# Patient Record
Sex: Male | Born: 1955 | Race: White | Hispanic: No | Marital: Married | Smoking: Never smoker
Health system: Southern US, Community
[De-identification: ages and names within clinical notes are randomized; demographics above are authoritative.]

## PROBLEM LIST (undated history)

## (undated) DIAGNOSIS — I493 Ventricular premature depolarization: Principal | ICD-10-CM

## (undated) DIAGNOSIS — M5030 Other cervical disc degeneration, unspecified cervical region: Secondary | ICD-10-CM

## (undated) DIAGNOSIS — G4730 Sleep apnea, unspecified: Secondary | ICD-10-CM

## (undated) DIAGNOSIS — R0789 Other chest pain: Secondary | ICD-10-CM

## (undated) DIAGNOSIS — R002 Palpitations: Secondary | ICD-10-CM

## (undated) DIAGNOSIS — T7840XA Allergy, unspecified, initial encounter: Secondary | ICD-10-CM

## (undated) DIAGNOSIS — N183 Chronic kidney disease, stage 3 unspecified: Secondary | ICD-10-CM

## (undated) DIAGNOSIS — K219 Gastro-esophageal reflux disease without esophagitis: Secondary | ICD-10-CM

## (undated) DIAGNOSIS — K449 Diaphragmatic hernia without obstruction or gangrene: Secondary | ICD-10-CM

## (undated) DIAGNOSIS — M109 Gout, unspecified: Secondary | ICD-10-CM

## (undated) DIAGNOSIS — K760 Fatty (change of) liver, not elsewhere classified: Secondary | ICD-10-CM

## (undated) DIAGNOSIS — I10 Essential (primary) hypertension: Secondary | ICD-10-CM

## (undated) DIAGNOSIS — Z8249 Family history of ischemic heart disease and other diseases of the circulatory system: Secondary | ICD-10-CM

## (undated) HISTORY — DX: Family history of ischemic heart disease and other diseases of the circulatory system: Z82.49

## (undated) HISTORY — DX: Chronic kidney disease, stage 3 unspecified: N18.30

## (undated) HISTORY — PX: NO PAST SURGERIES: SHX2092

## (undated) HISTORY — DX: Gastro-esophageal reflux disease without esophagitis: K21.9

## (undated) HISTORY — DX: Other cervical disc degeneration, unspecified cervical region: M50.30

## (undated) HISTORY — DX: Fatty (change of) liver, not elsewhere classified: K76.0

## (undated) HISTORY — DX: Ventricular premature depolarization: I49.3

## (undated) HISTORY — DX: Other chest pain: R07.89

## (undated) HISTORY — DX: Sleep apnea, unspecified: G47.30

## (undated) HISTORY — DX: Gout, unspecified: M10.9

## (undated) HISTORY — DX: Allergy, unspecified, initial encounter: T78.40XA

## (undated) HISTORY — DX: Diaphragmatic hernia without obstruction or gangrene: K44.9

---

## 2001-07-17 ENCOUNTER — Encounter: Admission: RE | Admit: 2001-07-17 | Discharge: 2001-10-15 | Payer: Self-pay | Admitting: Family Medicine

## 2005-02-24 ENCOUNTER — Emergency Department (HOSPITAL_COMMUNITY): Admission: EM | Admit: 2005-02-24 | Discharge: 2005-02-25 | Payer: Self-pay | Admitting: Emergency Medicine

## 2006-05-22 ENCOUNTER — Encounter: Admission: RE | Admit: 2006-05-22 | Discharge: 2006-05-22 | Payer: Self-pay | Admitting: Gastroenterology

## 2006-07-20 ENCOUNTER — Emergency Department (HOSPITAL_COMMUNITY): Admission: EM | Admit: 2006-07-20 | Discharge: 2006-07-20 | Payer: Self-pay | Admitting: Emergency Medicine

## 2006-10-03 ENCOUNTER — Emergency Department (HOSPITAL_COMMUNITY): Admission: EM | Admit: 2006-10-03 | Discharge: 2006-10-03 | Payer: Self-pay | Admitting: Emergency Medicine

## 2006-10-09 ENCOUNTER — Encounter: Admission: RE | Admit: 2006-10-09 | Discharge: 2006-10-09 | Payer: Self-pay | Admitting: Gastroenterology

## 2010-08-28 ENCOUNTER — Encounter: Payer: Self-pay | Admitting: Gastroenterology

## 2015-10-16 ENCOUNTER — Encounter (HOSPITAL_BASED_OUTPATIENT_CLINIC_OR_DEPARTMENT_OTHER): Payer: Self-pay | Admitting: Emergency Medicine

## 2015-10-16 ENCOUNTER — Other Ambulatory Visit: Payer: Self-pay

## 2015-10-16 ENCOUNTER — Emergency Department (HOSPITAL_BASED_OUTPATIENT_CLINIC_OR_DEPARTMENT_OTHER): Payer: Managed Care, Other (non HMO)

## 2015-10-16 ENCOUNTER — Emergency Department (HOSPITAL_BASED_OUTPATIENT_CLINIC_OR_DEPARTMENT_OTHER)
Admission: EM | Admit: 2015-10-16 | Discharge: 2015-10-17 | Disposition: A | Discharge status: Home / Self Care | Payer: Managed Care, Other (non HMO) | Attending: Emergency Medicine | Admitting: Emergency Medicine

## 2015-10-16 DIAGNOSIS — S20311A Abrasion of right front wall of thorax, initial encounter: Secondary | ICD-10-CM | POA: Diagnosis not present

## 2015-10-16 DIAGNOSIS — R002 Palpitations: Secondary | ICD-10-CM | POA: Diagnosis present

## 2015-10-16 DIAGNOSIS — Y92410 Unspecified street and highway as the place of occurrence of the external cause: Secondary | ICD-10-CM | POA: Insufficient documentation

## 2015-10-16 DIAGNOSIS — S40211A Abrasion of right shoulder, initial encounter: Secondary | ICD-10-CM | POA: Diagnosis not present

## 2015-10-16 DIAGNOSIS — S20211A Contusion of right front wall of thorax, initial encounter: Secondary | ICD-10-CM | POA: Insufficient documentation

## 2015-10-16 DIAGNOSIS — Z79899 Other long term (current) drug therapy: Secondary | ICD-10-CM | POA: Diagnosis not present

## 2015-10-16 DIAGNOSIS — R739 Hyperglycemia, unspecified: Secondary | ICD-10-CM | POA: Insufficient documentation

## 2015-10-16 DIAGNOSIS — I10 Essential (primary) hypertension: Secondary | ICD-10-CM | POA: Diagnosis not present

## 2015-10-16 DIAGNOSIS — Y9389 Activity, other specified: Secondary | ICD-10-CM | POA: Diagnosis not present

## 2015-10-16 DIAGNOSIS — S40011A Contusion of right shoulder, initial encounter: Secondary | ICD-10-CM | POA: Diagnosis not present

## 2015-10-16 DIAGNOSIS — Y998 Other external cause status: Secondary | ICD-10-CM | POA: Insufficient documentation

## 2015-10-16 DIAGNOSIS — I493 Ventricular premature depolarization: Secondary | ICD-10-CM | POA: Diagnosis not present

## 2015-10-16 HISTORY — DX: Essential (primary) hypertension: I10

## 2015-10-16 HISTORY — DX: Palpitations: R00.2

## 2015-10-16 LAB — BASIC METABOLIC PANEL
Anion gap: 9 (ref 5–15)
BUN: 23 mg/dL — AB (ref 6–20)
CHLORIDE: 100 mmol/L — AB (ref 101–111)
CO2: 25 mmol/L (ref 22–32)
Calcium: 8.7 mg/dL — ABNORMAL LOW (ref 8.9–10.3)
Creatinine, Ser: 1.24 mg/dL (ref 0.61–1.24)
GFR calc Af Amer: >60 mL/min (ref >60)
Glucose, Bld: 161 mg/dL — ABNORMAL HIGH (ref 65–99)
POTASSIUM: 3.7 mmol/L (ref 3.5–5.1)
SODIUM: 134 mmol/L — AB (ref 135–145)

## 2015-10-16 LAB — CBC WITH DIFFERENTIAL/PLATELET
Basophils Absolute: 0 10*3/uL (ref 0.0–0.1)
Basophils Relative: 1 %
EOS ABS: 0.3 10*3/uL (ref 0.0–0.7)
EOS PCT: 4 %
HCT: 44.2 % (ref 39.0–52.0)
Hemoglobin: 14.6 g/dL (ref 13.0–17.0)
LYMPHS ABS: 2.4 10*3/uL (ref 0.7–4.0)
LYMPHS PCT: 30 %
MCH: 28.3 pg (ref 26.0–34.0)
MCHC: 33 g/dL (ref 30.0–36.0)
MCV: 85.8 fL (ref 78.0–100.0)
MONO ABS: 0.6 10*3/uL (ref 0.1–1.0)
MONOS PCT: 8 %
Neutro Abs: 4.6 10*3/uL (ref 1.7–7.7)
Neutrophils Relative %: 57 %
PLATELETS: 243 10*3/uL (ref 150–400)
RBC: 5.15 MIL/uL (ref 4.22–5.81)
RDW: 13.4 % (ref 11.5–15.5)
WBC: 8 10*3/uL (ref 4.0–10.5)

## 2015-10-16 LAB — D-DIMER, QUANTITATIVE (NOT AT ARMC): D DIMER QUANT: 0.42 ug{FEU}/mL (ref 0.00–0.50)

## 2015-10-16 NOTE — ED Notes (Signed)
Patient states that he feels like his heart is skipping a beat. The patient reports that about 8 years ago he has episodes of increased PVC/ The patient denies chest pain.
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
## 2015-10-16 NOTE — ED Notes (Signed)
Patient is having frequent PVC's including cuplets in triage.

## 2015-10-17 LAB — TROPONIN I
Troponin I: <0.03 ng/mL (ref <0.031)
Troponin I: <0.03 ng/mL (ref <0.031)

## 2015-10-17 NOTE — ED Notes (Signed)
Assumed care of patient from [place], [place]. PT resting quietly at this time. NO distress. Denies pain. Will reassess Troponin at 0200.

## 2015-10-17 NOTE — Discharge Instructions (Signed)
He was seen today for palpitations. Your workup is reassuring. You were noted to have a blood sugar of 161. You need to have this rechecked by her primary Dr. as he has no history of diabetes. You need follow-up with cardiology for further evaluation. He may benefit from being placed on a beta blocker.  Premature Ventricular Contraction A premature ventricular contraction is an irregularity in the normal heart rhythm. These contractions are extra heartbeats that occur too early in the normal sequence. In most cases, these contractions are harmless and do not require treatment. CAUSES Premature ventricular contractions may occur without a known cause. In healthy people, the extra contractions may be caused by:  Smoking.  Drinking alcohol.  Caffeine.  Certain medicines.  Some illegal drugs.  Stress. Sometimes, changes in chemicals in the blood (electrolytes) can also cause premature ventricular contractions. They can also occur in people with heart diseases that cause a decrease in blood flow to the heart. SIGNS AND SYMPTOMS Premature ventricular contractions often do not cause any symptoms. In some cases, you may have a feeling of your heart beating fast or skipping a beat (palpitations). DIAGNOSIS Your health care provider will take your medical history and do a physical exam. During the exam, the health care provider will check for irregular heartbeats. Various tests may be done to help diagnose premature ventricular contractions. These tests may include:  An ECG (electrocardiogram) to monitor the electrical activity of your heart.  Holter monitor testing. A Holter monitor is a portable device that can monitor the electrical activity of your heart over longer periods of time.  Stress tests to see how exercise affects your heart rhythm.  Echocardiogram. This test uses sound waves (ultrasound) to produce an image of your heart.  Electrophysiology study. This is used to evaluate the  electrical conduction system of your heart. TREATMENT Usually, no treatment is needed. You may be advised to avoid things that can trigger the premature contractions, such as caffeine or alcohol. Medicines are sometimes given if symptoms are severe or if the extra heartbeats are very frequent. Treatment may also be needed for an underlying cause of the contractions if one is found. HOME CARE INSTRUCTIONS  Take medicines only as directed by your health care provider.  Make any lifestyle changes recommended by your health care provider. These may include:  Quitting smoking.  Avoiding or limiting caffeine or alcohol.  Exercising. Talk to your health care provider about what type of exercise is safe for you.  Trying to reduce stress.  Keep all follow-up visits with your health care provider. This is important. SEEK IMMEDIATE MEDICAL CARE IF:  You feel palpitations that are frequent or continual.  You have chest pain.  You have shortness of breath.  You have sweating for no reason.  You have nausea and vomiting.  You become light-headed or faint.   This information is not intended to replace advice given to you by your health care provider. Make sure you discuss any questions you have with your health care provider.   Document Released: 03/10/2004 Document Revised: 08/14/2014 Document Reviewed: 12/25/2013 Elsevier Interactive Patient Education [employer]2016 Elsevier Inc.

## 2015-11-02 ENCOUNTER — Encounter: Payer: Self-pay | Admitting: Cardiology

## 2015-11-02 ENCOUNTER — Ambulatory Visit (INDEPENDENT_AMBULATORY_CARE_PROVIDER_SITE_OTHER): Payer: Managed Care, Other (non HMO) | Admitting: Cardiology

## 2015-11-02 VITALS — BP 138/86 | HR 82 | Ht 70.0 in | Wt 180.0 lb

## 2015-11-02 DIAGNOSIS — Z8249 Family history of ischemic heart disease and other diseases of the circulatory system: Secondary | ICD-10-CM

## 2015-11-02 DIAGNOSIS — R079 Chest pain, unspecified: Secondary | ICD-10-CM | POA: Insufficient documentation

## 2015-11-02 DIAGNOSIS — I159 Secondary hypertension, unspecified: Secondary | ICD-10-CM

## 2015-11-02 DIAGNOSIS — I493 Ventricular premature depolarization: Secondary | ICD-10-CM

## 2015-11-02 DIAGNOSIS — I10 Essential (primary) hypertension: Secondary | ICD-10-CM | POA: Diagnosis not present

## 2015-11-02 HISTORY — DX: Ventricular premature depolarization: I49.3

## 2015-11-02 HISTORY — DX: Family history of ischemic heart disease and other diseases of the circulatory system: Z82.49

## 2015-11-02 LAB — TSH: TSH: 1.7 mIU/L (ref 0.40–4.50)
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
## 2015-11-02 NOTE — Patient Instructions (Signed)
Medication Instructions:  Your physician recommends that you continue on your current medications as directed. Please refer to the Current Medication list given to you today.   Labwork: TODAY: TSH  Testing/Procedures: Your physician has requested that you have an echocardiogram. Echocardiography is a painless test that uses sound waves to create images of your heart. It provides your doctor with information about the size and shape of your heart and how well your heart's chambers and valves are working. This procedure takes approximately one hour. There are no restrictions for this procedure.   Your physician has recommended that you wear a holter monitor. Holter monitors are medical devices that record the heart's electrical activity. Doctors most often use these monitors to diagnose arrhythmias. Arrhythmias are problems with the speed or rhythm of the heartbeat. The monitor is a small, portable device. You can wear one while you do your normal daily activities. This is usually used to diagnose what is causing palpitations/syncope (passing out).  Dr. Mayford Knifeurner recommends you have an EXERCISE NUCLEAR STRESS TEST.  Follow-Up: Your physician wants you to follow-up in: 1 year with Dr. Mayford Knifeurner. You will receive a reminder letter in the mail two months in advance. If you don't receive a letter, please call our office to schedule the follow-up appointment.   Any Other Special Instructions Will Be Listed Below (If Applicable).     If you need a refill on your cardiac medications before your next appointment, please call your pharmacy.

## 2015-11-17 ENCOUNTER — Telehealth (HOSPITAL_COMMUNITY): Payer: Self-pay | Admitting: *Deleted

## 2015-11-17 NOTE — Telephone Encounter (Signed)
Left message on voicemail in reference to upcoming appointment scheduled for 11/22/15. Phone number given for a call back so details instructions can be given. Vane Yapp J Malinda Mayden, RN 

## 2015-11-19 ENCOUNTER — Ambulatory Visit (HOSPITAL_BASED_OUTPATIENT_CLINIC_OR_DEPARTMENT_OTHER): Payer: Managed Care, Other (non HMO)

## 2015-11-19 ENCOUNTER — Ambulatory Visit (INDEPENDENT_AMBULATORY_CARE_PROVIDER_SITE_OTHER): Payer: Managed Care, Other (non HMO)

## 2015-11-19 ENCOUNTER — Other Ambulatory Visit: Payer: Self-pay

## 2015-11-19 ENCOUNTER — Ambulatory Visit (HOSPITAL_COMMUNITY): Payer: Managed Care, Other (non HMO) | Attending: Cardiovascular Disease

## 2015-11-19 DIAGNOSIS — I493 Ventricular premature depolarization: Secondary | ICD-10-CM | POA: Insufficient documentation

## 2015-11-19 DIAGNOSIS — I4510 Unspecified right bundle-branch block: Secondary | ICD-10-CM | POA: Insufficient documentation

## 2015-11-19 DIAGNOSIS — Z8249 Family history of ischemic heart disease and other diseases of the circulatory system: Secondary | ICD-10-CM | POA: Diagnosis not present

## 2015-11-19 DIAGNOSIS — I10 Essential (primary) hypertension: Secondary | ICD-10-CM | POA: Diagnosis not present

## 2015-11-19 DIAGNOSIS — R002 Palpitations: Secondary | ICD-10-CM | POA: Diagnosis not present

## 2015-11-19 DIAGNOSIS — R079 Chest pain, unspecified: Secondary | ICD-10-CM | POA: Diagnosis not present

## 2015-11-19 DIAGNOSIS — I340 Nonrheumatic mitral (valve) insufficiency: Secondary | ICD-10-CM | POA: Insufficient documentation

## 2015-11-19 LAB — MYOCARDIAL PERFUSION IMAGING
CHL CUP NUCLEAR SRS: 9
CHL CUP NUCLEAR SSS: 7
CSEPHR: 109 %
CSEPPHR: 176 {beats}/min
Estimated workload: 10.1 METS
Exercise duration (min): 9 min
Exercise duration (sec): 0 s
LHR: 0.23
LV dias vol: 60 mL (ref 62–150)
LVSYSVOL: 16 mL
MPHR: 161 {beats}/min
Rest HR: 72 {beats}/min
SDS: 0
TID: 0.89

## 2015-11-19 MED ORDER — TECHNETIUM TC 99M SESTAMIBI GENERIC - CARDIOLITE
10.7000 | Freq: Once | INTRAVENOUS | Status: AC | PRN
Start: 2015-11-19 — End: 2015-11-19
  Administered 2015-11-19: 11 via INTRAVENOUS

## 2015-11-19 MED ORDER — TECHNETIUM TC 99M SESTAMIBI GENERIC - CARDIOLITE
32.9000 | Freq: Once | INTRAVENOUS | Status: AC | PRN
  Administered 2015-11-19: 32.9 via INTRAVENOUS

## 2015-11-22 ENCOUNTER — Telehealth: Payer: Self-pay

## 2015-11-22 NOTE — Telephone Encounter (Signed)
Informed patient of results and verbal understanding expressed.   Patient requests a call from a monitor tech to discuss sounds his holter made while he wore it to make sure his results will be accurate.  To Derek Kent and Derek Kent for follow-up.

## 2015-11-29 ENCOUNTER — Telehealth: Payer: Self-pay | Admitting: *Deleted

## 2015-11-29 NOTE — Telephone Encounter (Signed)
See 4/24 phone encounter.

## 2015-11-29 NOTE — Telephone Encounter (Signed)
Call requested from patient to speak to monitor tech to check accuracy of holter monitor since it made noise during recording. Informed patient we could not give him any information regarding the quality or accuracy of his holter monitor recording until the results were back from [facility]. Results were imported and forwarded to Dr. Mayford Knifeurner for review.  Derek FellsKaty Kemp, RN, will call with results.  However there was no indication of monitor malfunction on his report.

## 2015-12-01 ENCOUNTER — Telehealth: Payer: Self-pay

## 2015-12-01 NOTE — Telephone Encounter (Signed)
Quintella Reichertraci R Turner, MD   Sent: Mon November 29, 2015 7:39 PM    To: Henrietta DineKathryn A Ada Woodbury, RN        Message     NOrmal LVF and no ischemia no stress test. PVC's are benign. Can treat with meds if patient wishes but these are minimal and benign.     Unable to leave message as VM has not yet been set up.  Will try again later.

## 2015-12-03 NOTE — Telephone Encounter (Signed)
Informed patient of results and verbal understanding expressed.  Patient is asymptomatic at this time. He st he will call if PVCs become a problem to start medication. He was grateful for call.

## 2016-10-23 ENCOUNTER — Encounter: Payer: Self-pay | Admitting: *Deleted

## 2016-11-01 ENCOUNTER — Ambulatory Visit (INDEPENDENT_AMBULATORY_CARE_PROVIDER_SITE_OTHER): Payer: Managed Care, Other (non HMO) | Admitting: Cardiology

## 2016-11-01 ENCOUNTER — Ambulatory Visit (INDEPENDENT_AMBULATORY_CARE_PROVIDER_SITE_OTHER)
Admission: RE | Admit: 2016-11-01 | Discharge: 2016-11-01 | Disposition: A | Discharge status: Home / Self Care | Payer: Self-pay | Source: Ambulatory Visit | Attending: Cardiology | Admitting: Cardiology

## 2016-11-01 ENCOUNTER — Encounter: Payer: Self-pay | Admitting: Cardiology

## 2016-11-01 VITALS — BP 120/82 | HR 88 | Ht 69.5 in | Wt 185.0 lb

## 2016-11-01 DIAGNOSIS — I493 Ventricular premature depolarization: Secondary | ICD-10-CM | POA: Diagnosis not present

## 2016-11-01 DIAGNOSIS — I340 Nonrheumatic mitral (valve) insufficiency: Secondary | ICD-10-CM

## 2016-11-01 DIAGNOSIS — R079 Chest pain, unspecified: Secondary | ICD-10-CM

## 2016-11-01 DIAGNOSIS — R0789 Other chest pain: Secondary | ICD-10-CM | POA: Insufficient documentation

## 2016-11-01 DIAGNOSIS — I10 Essential (primary) hypertension: Secondary | ICD-10-CM

## 2016-11-01 NOTE — Patient Instructions (Signed)
Medication Instructions:  Your physician recommends that you continue on your current medications as directed. Please refer to the Current Medication list given to you today.   Labwork: None  Testing/Procedures: Your physician has requested that you have an echocardiogram in April, 2019. Echocardiography is a painless test that uses sound waves to create images of your heart. It provides your doctor with information about the size and shape of your heart and how well your heart's chambers and valves are working. This procedure takes approximately one hour. There are no restrictions for this procedure.   Dr. Mayford Knifeurner recommends you have a CALCIUM SCORE today.  Follow-Up: Your physician wants you to follow-up in: 1 year with Dr. Mayford Knifeurner. You will receive a reminder letter in the mail two months in advance. If you don't receive a letter, please call our office to schedule the follow-up appointment.   Any Other Special Instructions Will Be Listed Below (If Applicable).     If you need a refill on your cardiac medications before your next appointment, please call your pharmacy.
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
## 2016-11-02 ENCOUNTER — Other Ambulatory Visit: Payer: Managed Care, Other (non HMO)

## 2017-03-01 IMAGING — CR DG CHEST 2V
2 series · 2 of 2 positions shown · non-contrast
Comparison: None.

CLINICAL DATA: Palpitations for 1 hour.

EXAM:
CHEST  2 VIEW

[w chest pa]
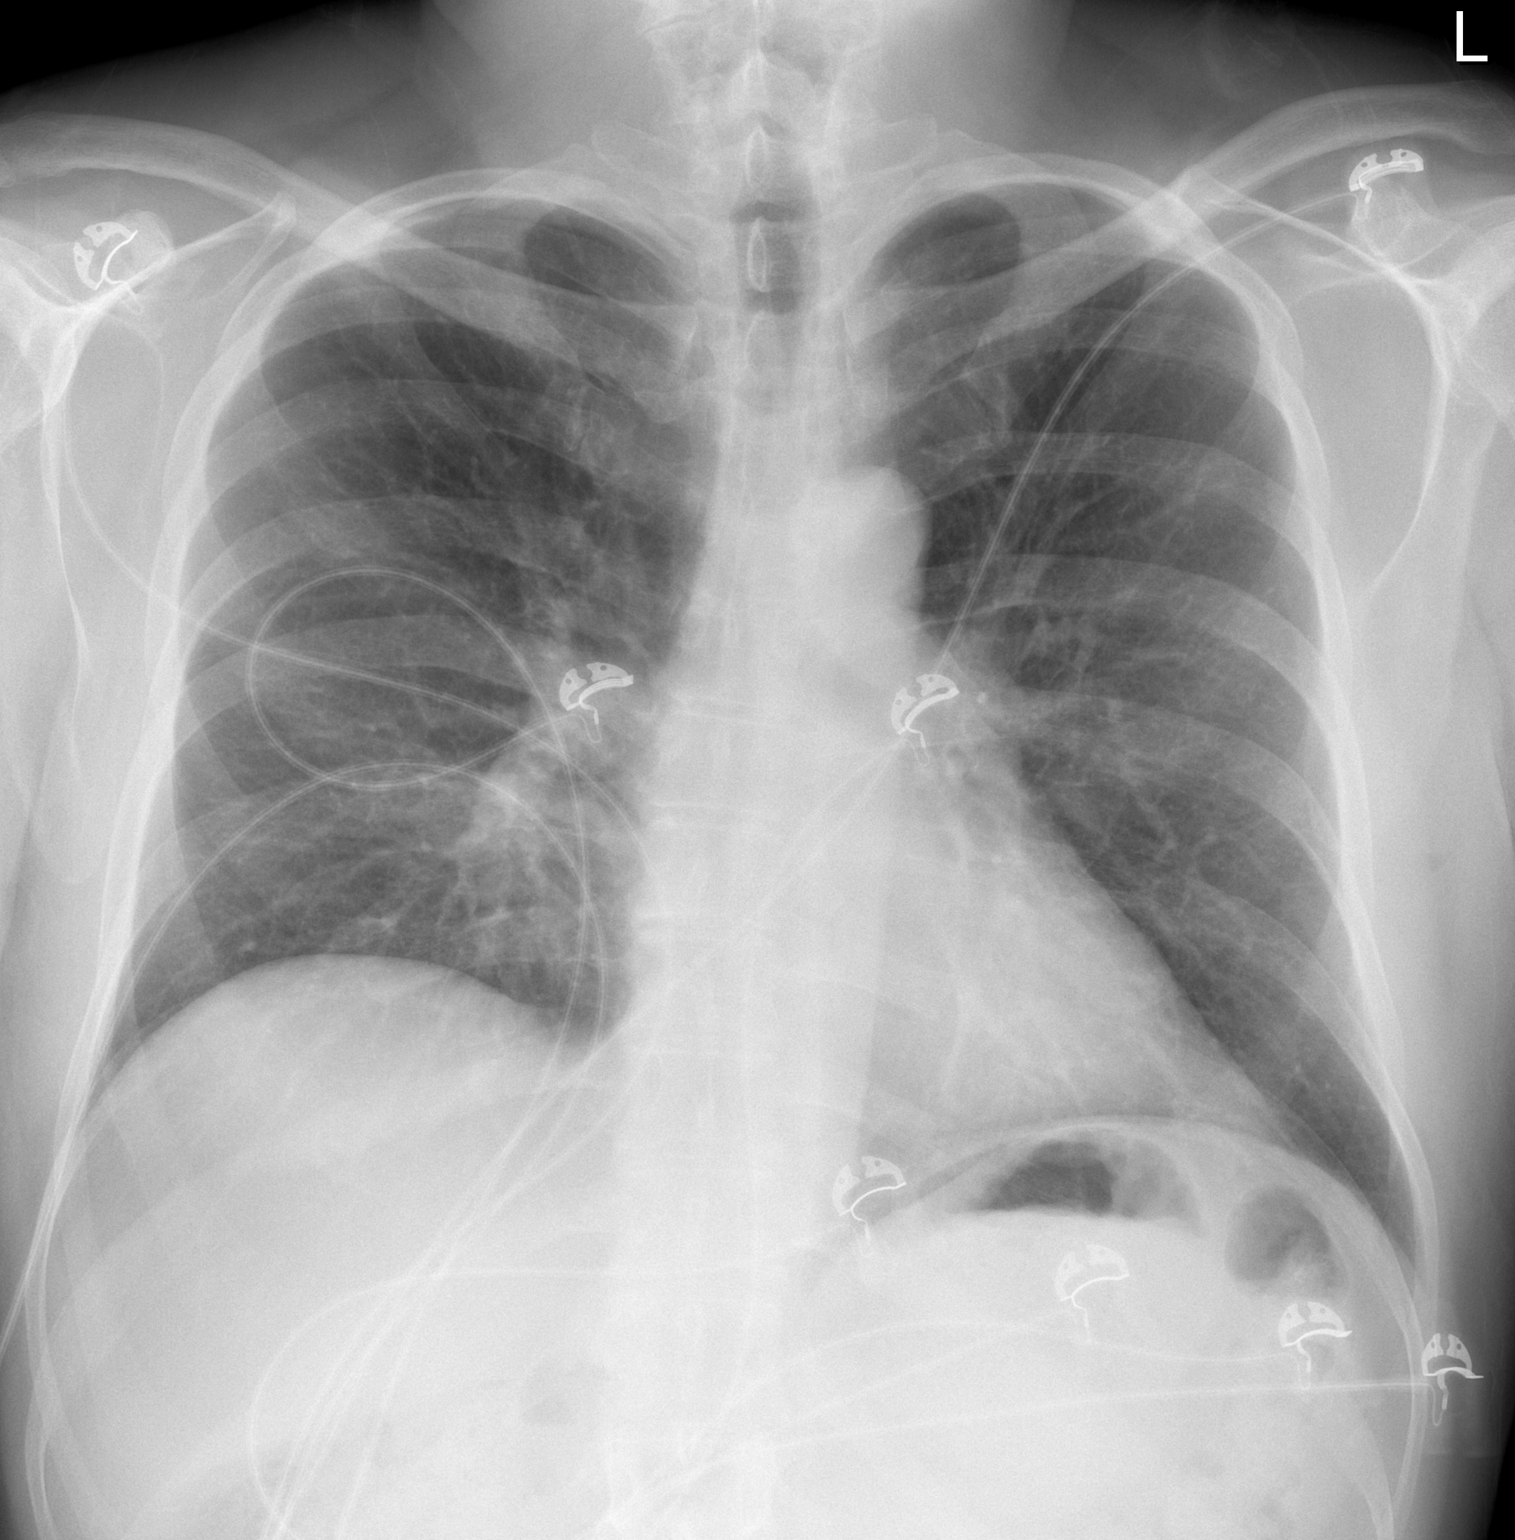

[w chest lat]
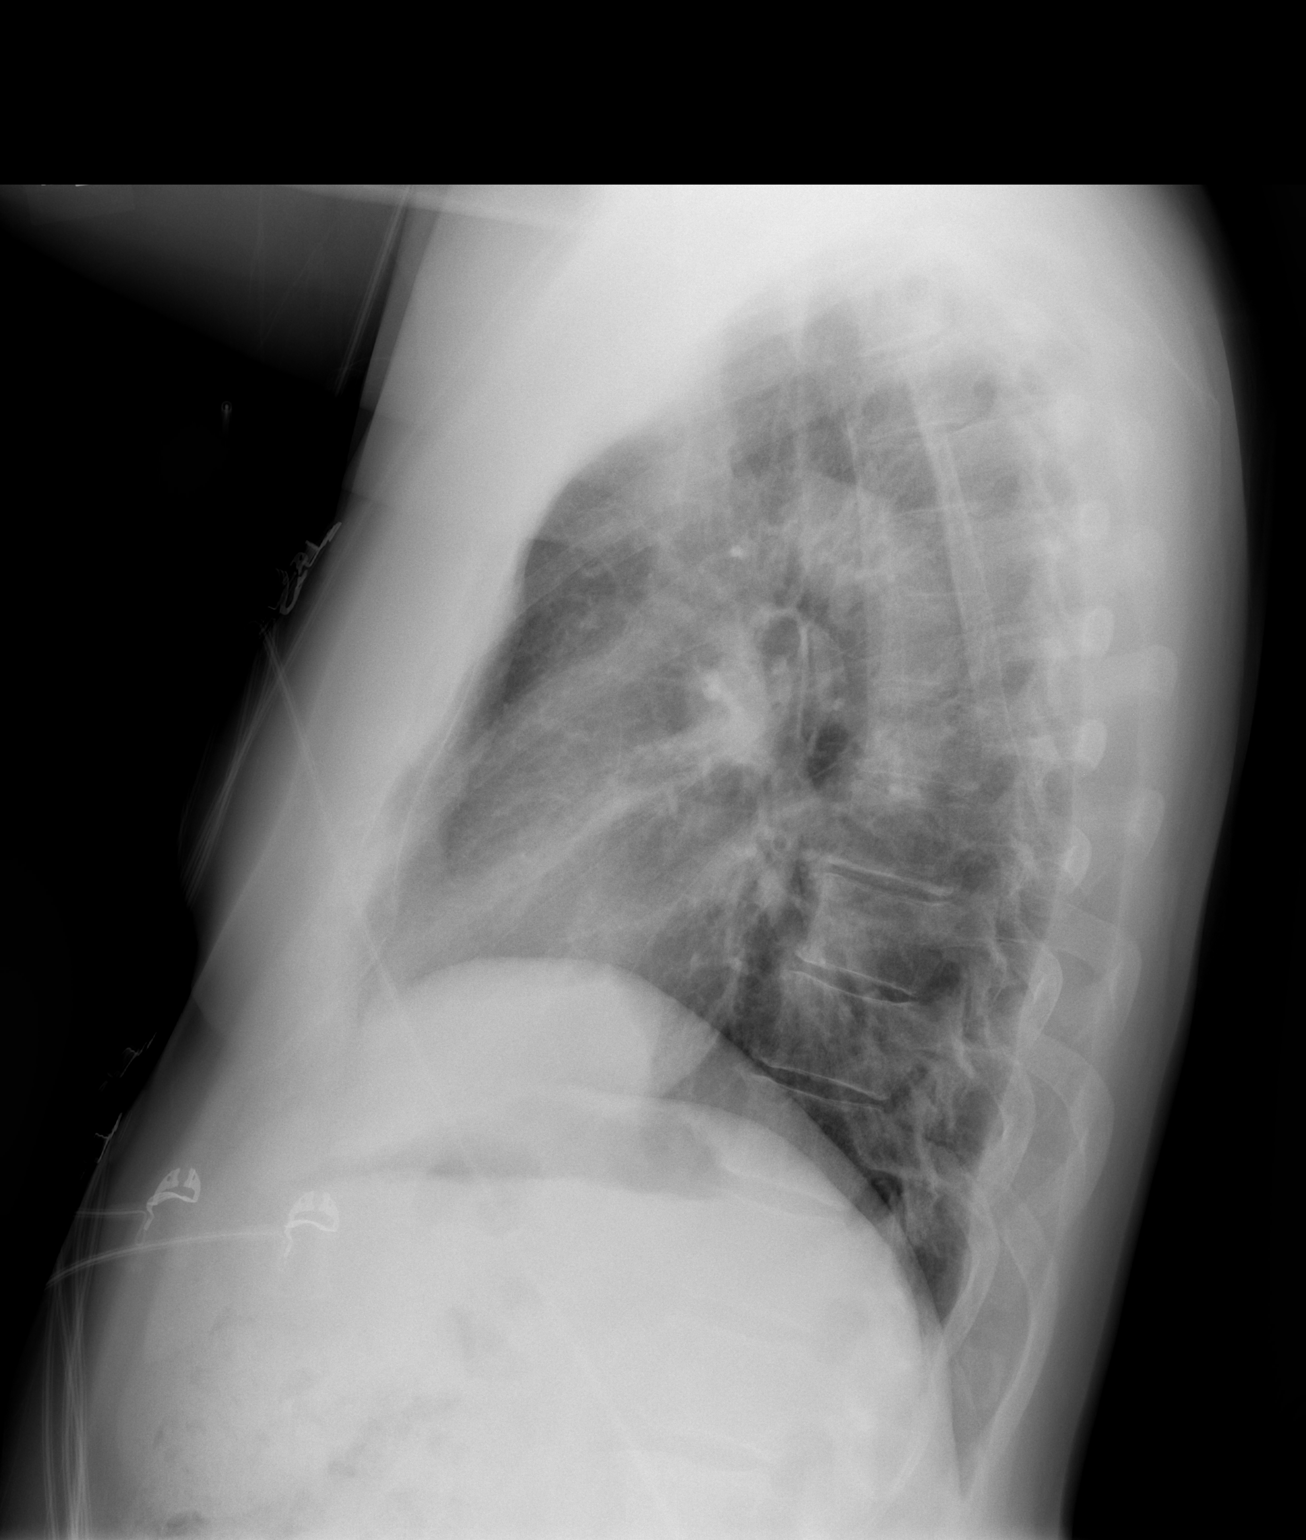

[2 of 2 positions shown; findings below may reference images not displayed]

FINDINGS: The cardiomediastinal contours are normal. The lungs are clear.
Pulmonary vasculature is normal. No consolidation, pleural effusion,
or pneumothorax. No acute osseous abnormalities are seen.
IMPRESSION: No acute pulmonary process.

## 2017-04-03 IMAGING — NM NM MISC PROCEDURE
6 series · 36 of 36 positions shown · non-contrast
Comparison: none

[Series 1: wbr_r-proj_st rest · 6.51mm/px · 6 of 64 frames shown]
[frame 6/64]
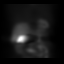
[frame 16/64]
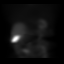
[frame 27/64]
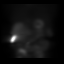
[frame 38/64]
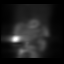
[frame 48/64]
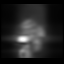
[frame 59/64]
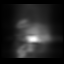

[Series 1: rest · 6.51mm/px · 6 of 64 frames shown]
[frame 6/64]
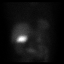
[frame 16/64]
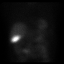
[frame 27/64]
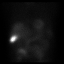
[frame 38/64]
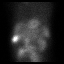
[frame 48/64]
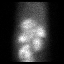
[frame 59/64]
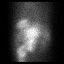

[Series 2: wbr_s-proj_st stress · 6.51mm/px · 6 of 64 frames shown (1 of 2)]
[frame 6/64]
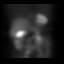
[frame 16/64]
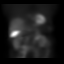
[frame 27/64]
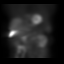
[frame 38/64]
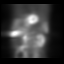
[frame 48/64]
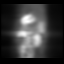
[frame 59/64]
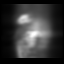

[Series 2: wbr_s-proj_st stress · 6.51mm/px · 6 of 512 frames shown (2 of 2)]
[frame 43/512]
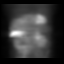
[frame 128/512]
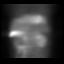
[frame 214/512]
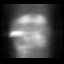
[frame 299/512]
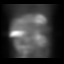
[frame 384/512]
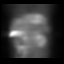
[frame 470/512]
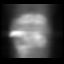

[Series 2: stress · 6.51mm/px · 6 of 512 frames shown (1 of 2)]
[frame 43/512]
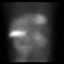
[frame 128/512]
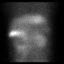
[frame 214/512]
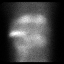
[frame 299/512]
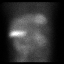
[frame 384/512]
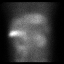
[frame 470/512]
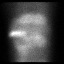

[Series 2: stress · 6.51mm/px · 6 of 64 frames shown (2 of 2)]
[frame 6/64]
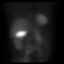
[frame 16/64]
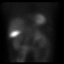
[frame 27/64]
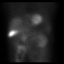
[frame 38/64]
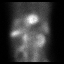
[frame 48/64]
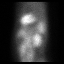
[frame 59/64]
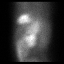

[36 of 36 positions shown; findings below may reference images not displayed]

Canned report from images found in remote index.

Refer to host system for actual result text.

## 2017-11-01 ENCOUNTER — Other Ambulatory Visit (HOSPITAL_COMMUNITY): Payer: Managed Care, Other (non HMO)

## 2017-11-09 ENCOUNTER — Other Ambulatory Visit (HOSPITAL_COMMUNITY): Payer: Managed Care, Other (non HMO)

## 2017-12-03 ENCOUNTER — Other Ambulatory Visit (HOSPITAL_COMMUNITY): Payer: Managed Care, Other (non HMO)

## 2017-12-05 ENCOUNTER — Ambulatory Visit (HOSPITAL_COMMUNITY): Payer: Managed Care, Other (non HMO) | Attending: Cardiology

## 2017-12-05 ENCOUNTER — Other Ambulatory Visit: Payer: Self-pay

## 2017-12-05 DIAGNOSIS — Z8249 Family history of ischemic heart disease and other diseases of the circulatory system: Secondary | ICD-10-CM | POA: Insufficient documentation

## 2017-12-05 DIAGNOSIS — I493 Ventricular premature depolarization: Secondary | ICD-10-CM | POA: Diagnosis not present

## 2017-12-05 DIAGNOSIS — I10 Essential (primary) hypertension: Secondary | ICD-10-CM | POA: Insufficient documentation

## 2017-12-05 DIAGNOSIS — I340 Nonrheumatic mitral (valve) insufficiency: Secondary | ICD-10-CM

## 2017-12-26 ENCOUNTER — Encounter: Payer: Self-pay | Admitting: Cardiology

## 2017-12-26 ENCOUNTER — Ambulatory Visit: Payer: Managed Care, Other (non HMO) | Admitting: Cardiology

## 2017-12-26 VITALS — BP 130/80 | HR 80 | Ht 69.0 in | Wt 185.1 lb

## 2017-12-26 DIAGNOSIS — Z8249 Family history of ischemic heart disease and other diseases of the circulatory system: Secondary | ICD-10-CM

## 2017-12-26 DIAGNOSIS — I493 Ventricular premature depolarization: Secondary | ICD-10-CM

## 2017-12-26 DIAGNOSIS — I10 Essential (primary) hypertension: Secondary | ICD-10-CM

## 2017-12-26 NOTE — Patient Instructions (Signed)
Medication Instructions:  Your physician recommends that you continue on your current medications as directed. Please refer to the Current Medication list given to you today.  If you need a refill on your cardiac medications, please contact your pharmacy first.  Labwork: None ordered   Testing/Procedures: None ordered   Follow-Up: Your physician wants you to follow-up AS NEEDED with Dr. Turner   Any Other Special Instructions Will Be Listed Below (If Applicable).   Thank you for choosing [facility]    Rena Scotty Pinder, RN  [phone]  If you need a refill on your cardiac medications before your next appointment, please call your pharmacy.   

## 2017-12-26 NOTE — Progress Notes (Signed)
Cardiology Office Note:    Date:  12/26/2017   ID:  Derek Kent, DOB 1955-09-05, MRN [id number]  PCP:  Catha Gosselin, MD  Cardiologist:  No primary care provider on file.    Referring MD: Catha Gosselin, MD   Chief Complaint  Patient presents with  . Chest Pain  . Hypertension    History of Present Illness:    Derek Kent is a 62 y.o. male with a hx of HTN and PVCs.  2D echo showed normal LVF and mild MR and nuclear stress test done for CP showed no ischemia in 2017.  Chest CT for calcium score was 0 in 2018.  He is here today for followup and is doing well.  He denies any chest pain or pressure, SOB, DOE, PND, orthopnea, LE edema, dizziness, palpitations or syncope. He is compliant with his meds and is tolerating meds with no SE.    Past Medical History:  Diagnosis Date  . Atypical chest pain    normal echo and nuclear stress test 2017  . Family history of premature coronary artery disease 11/02/2015  . Hypertension   . Palpitations   . PVC's (premature ventricular contractions) 11/02/2015    Past Surgical History:  Procedure Laterality Date  . NO PAST SURGERIES      Current Medications: Current Meds  Medication Sig  . allopurinol (ZYLOPRIM) 100 MG tablet Take 100 mg by mouth daily.  . diphenhydrAMINE (BENADRYL) 25 mg capsule Take 25 mg by mouth every 6 (six) hours as needed for allergies.  Marland Kitchen lisinopril (PRINIVIL,ZESTRIL) 10 MG tablet Take 10 mg by mouth daily.  . Multiple Vitamins-Minerals (MULTIVITAMIN WITH MINERALS) tablet Take 1 tablet by mouth daily.     Allergies:   Ibuprofen   Social History   Socioeconomic History  . Marital status: Married    Spouse name: Not on file  . Number of children: Not on file  . Years of education: Not on file  . Highest education level: Not on file  Occupational History  . Not on file  Social Needs  . Financial resource strain: Not on file  . Food insecurity:    Worry: Not on file    Inability: Not on file  .  Transportation needs:    Medical: Not on file    Non-medical: Not on file  Tobacco Use  . Smoking status: Never Smoker  . Smokeless tobacco: Never Used  Substance and Sexual Activity  . Alcohol use: No  . Drug use: No  . Sexual activity: Not on file  Lifestyle  . Physical activity:    Days per week: Not on file    Minutes per session: Not on file  . Stress: Not on file  Relationships  . Social connections:    Talks on phone: Not on file    Gets together: Not on file    Attends religious service: Not on file    Active member of club or organization: Not on file    Attends meetings of clubs or organizations: Not on file    Relationship status: Not on file  Other Topics Concern  . Not on file  Social History Narrative  . Not on file     Family History: The patient's family history includes Breast cancer in his mother; Heart attack in his maternal grandfather, maternal uncle, and paternal uncle; Heart disease in his father.  ROS:   Please see the history of present illness.    ROS  All other  systems reviewed and negative.   EKGs/Labs/Other Studies Reviewed:    The following studies were reviewed today: none  EKG:  EKG is  ordered today.  The ekg ordered today demonstrates normal sinus rhythm at 80 bpm with no ST-T wave at normalities.  Recent Labs: No results found for requested labs within last 8760 hours.   Recent Lipid Panel No results found for: CHOL, TRIG, HDL, CHOLHDL, VLDL, LDLCALC, LDLDIRECT  Physical Exam:    VS:  BP 130/80   Pulse 80   Ht  (1.753 m)   Wt 185 lb 1.9 oz (84 kg)   SpO2 98%   BMI 27.34 kg/m     Wt Readings from Last 3 Encounters:  12/26/17 185 lb 1.9 oz (84 kg)  11/01/16 185 lb (83.9 kg)  11/02/15 180 lb (81.6 kg)     GEN:  Well nourished, well developed in no acute distress HEENT: Normal NECK: No JVD; No carotid bruits LYMPHATICS: No lymphadenopathy CARDIAC: RRR, no murmurs, rubs, gallops RESPIRATORY:  Clear to  auscultation without rales, wheezing or rhonchi  ABDOMEN: Soft, non-tender, non-distended MUSCULOSKELETAL:  No edema; No deformity  SKIN: Warm and dry NEUROLOGIC:  Alert and oriented x 3 PSYCHIATRIC:  Normal affect   ASSESSMENT:    1. Benign essential HTN   2. PVC's (premature ventricular contractions)   3. Family history of premature coronary artery disease    PLAN:    In order of problems listed above:  1.  Hypertension - BP is well controlled on exam today.  He will continue on lisinopril 10 mg daily.  2.  PVCs -asymptomatic.  3.  Family history of premature CAD -he denies any anginal chest pain and nuclear stress test several years ago was normal.  Calcium score a year ago was 0.  Doing well and I  told him that his blood pressure can be followed by his PCP.  We will see him back on a as needed basis.   Medication Adjustments/Labs and Tests Ordered: Current medicines are reviewed at length with the patient today.  Concerns regarding medicines are outlined above.  Orders Placed This Encounter  Procedures  . EKG 12-Lead   No orders of the defined types were placed in this encounter.   Signed, Armanda Magic, MD  12/26/2017 8:03 AM    [facility]

## 2018-02-13 LAB — HM COLONOSCOPY

## 2018-08-19 ENCOUNTER — Telehealth: Payer: Self-pay | Admitting: Cardiology

## 2018-08-19 NOTE — Telephone Encounter (Signed)
New Messgae:    Patient is requesting to see the doctor today. Please call patient

## 2018-08-19 NOTE — Telephone Encounter (Signed)
Spoke with the patient, last week on Monday, Tuesday and Wednesday he felt his heart rate increase randomly. He denies chest pain and SOB. He stated he did not have any lifestyle changes. He stated that he has not had this occur, but only a few times in the past but not as frequent. He was fine on Thursday and Friday, but then felt more symptoms on Saturday and Sunday. He stated he also has burping that occurs occasionally with the tachycardia. He wanted an appointment today, advised that we had an appointment tomorrow with APP. He declined. Mentioned that an event monitor could be an option. Sending to Dr. Mayford Knifeurner for recommendations.

## 2018-08-19 NOTE — Telephone Encounter (Signed)
He has had PVCs in the past.  Please get a 2 week ZIO patch

## 2018-08-20 NOTE — Telephone Encounter (Signed)
Spoke with the patient, he decided to hold off on the monitor because he is feeling better. Advised the patient if his symptoms worse to contact our office again if he is interested in getting the monitor.

## 2018-09-30 ENCOUNTER — Telehealth: Payer: Self-pay | Admitting: Cardiology

## 2018-09-30 NOTE — Telephone Encounter (Signed)
Please get a 48 hour Holter to assess PVC load

## 2018-09-30 NOTE — Telephone Encounter (Signed)
New Message   Patient wants to speak to nurse about PVC number change he's been having after he eats since the first of the year.

## 2018-09-30 NOTE — Telephone Encounter (Signed)
Returned call to patient who states that since the beginning of the year he has noticed that his PVCs or skipped beats have become more frequent especially after he eats a meal. He states that the skipped beats are accompanied by belching and chest discomfort. He states that he was prescribed pepcid and his belching and chest discomfort have resolved but he continues to have frequent skipped beats for about 40 minutes after he eats lunch and dinner. He denies alcohol use, excessive caffeine, or any stimulants. Patient states that his thyroid function and electrolytes were both normal recently at PCP and EKG was done that was normal as well. Patient's BP 124/80 HR 90. Patient asymptomatic at this time.

## 2018-10-01 NOTE — Telephone Encounter (Signed)
Left a message to call back.

## 2018-10-02 NOTE — Telephone Encounter (Signed)
Spoke with the patient, he is going to call his insurance and see what the cost of a Holter monitor is and he will let us know if he wants to proceed.

## 2018-10-07 NOTE — Telephone Encounter (Signed)
Spoke with the patient, he stated he was feeling better and wanted to wait on the holter monitor for now. I advised that he let our office know if he experiences more palpitations. He had no further questions.

## 2020-09-09 ENCOUNTER — Other Ambulatory Visit: Payer: Self-pay | Admitting: Gastroenterology

## 2020-09-09 ENCOUNTER — Other Ambulatory Visit (HOSPITAL_COMMUNITY): Payer: Self-pay | Admitting: Gastroenterology

## 2020-09-09 DIAGNOSIS — K760 Fatty (change of) liver, not elsewhere classified: Secondary | ICD-10-CM

## 2020-09-23 ENCOUNTER — Encounter (HOSPITAL_COMMUNITY): Payer: Self-pay

## 2020-09-23 ENCOUNTER — Ambulatory Visit (HOSPITAL_COMMUNITY): Payer: Managed Care, Other (non HMO)

## 2020-10-08 ENCOUNTER — Ambulatory Visit
Admission: RE | Admit: 2020-10-08 | Discharge: 2020-10-08 | Disposition: A | Discharge status: Home / Self Care | Payer: Managed Care, Other (non HMO) | Source: Ambulatory Visit | Attending: Gastroenterology | Admitting: Gastroenterology

## 2020-10-08 DIAGNOSIS — K760 Fatty (change of) liver, not elsewhere classified: Secondary | ICD-10-CM

## 2021-08-19 ENCOUNTER — Other Ambulatory Visit (HOSPITAL_BASED_OUTPATIENT_CLINIC_OR_DEPARTMENT_OTHER): Payer: Self-pay

## 2021-08-19 ENCOUNTER — Ambulatory Visit: Payer: Managed Care, Other (non HMO) | Attending: Internal Medicine

## 2021-08-19 DIAGNOSIS — Z23 Encounter for immunization: Secondary | ICD-10-CM

## 2021-08-19 MED ORDER — PFIZER COVID-19 VAC BIVALENT 30 MCG/0.3ML IM SUSP
INTRAMUSCULAR | 0 refills | Status: DC
  Filled 2021-08-19: qty 0.3, 1d supply, fill #0

## 2021-08-19 NOTE — Progress Notes (Signed)
° °  Covid-19 Vaccination Clinic  Name:  Derek Kent    MRN: [id number] DOB: 01/15/1956  08/19/2021  Mr. Scollon was observed post Covid-19 immunization for 15 minutes without incident. He was provided with Vaccine Information Sheet and instruction to access the V-Safe system.   Mr. Pappalardo was instructed to call 911 with any severe reactions post vaccine: Difficulty breathing  Swelling of face and throat  A fast heartbeat  A bad rash all over body  Dizziness and weakness   Immunizations Administered     Name Date Dose VIS Date Route   Pfizer Covid-19 Vaccine Bivalent Booster 08/19/2021  1:19 PM 0.3 mL 04/06/2021 Intramuscular   Manufacturer: [place]   Lot: [id number]   [place]: [phone]

## 2021-08-19 NOTE — Progress Notes (Signed)
° °  Covid-19 Vaccination Clinic  Name:  Derek Kent    MRN: [id number] DOB: 1956-06-12  08/19/2021  [name] was observed post Covid-19 immunization for 15 minutes without incident. He was provided with Vaccine Information Sheet and instruction to access the V-Safe system.   [name] was instructed to call 911 with any severe reactions post vaccine: Difficulty breathing  Swelling of face and throat  A fast heartbeat  A bad rash all over body  Dizziness and weakness   Immunizations Administered     Name Date Dose VIS Date Route   Pfizer Covid-19 Vaccine Bivalent Booster 08/19/2021  1:19 PM 0.3 mL 04/06/2021 Intramuscular   Manufacturer: [place]   Lot: [id number]   [place]: [phone]

## 2021-08-23 DIAGNOSIS — K219 Gastro-esophageal reflux disease without esophagitis: Secondary | ICD-10-CM | POA: Diagnosis not present

## 2021-09-01 DIAGNOSIS — J3081 Allergic rhinitis due to animal (cat) (dog) hair and dander: Secondary | ICD-10-CM | POA: Diagnosis not present

## 2021-09-01 DIAGNOSIS — J3089 Other allergic rhinitis: Secondary | ICD-10-CM | POA: Diagnosis not present

## 2021-09-01 DIAGNOSIS — J301 Allergic rhinitis due to pollen: Secondary | ICD-10-CM | POA: Diagnosis not present

## 2021-09-06 DIAGNOSIS — G4733 Obstructive sleep apnea (adult) (pediatric): Secondary | ICD-10-CM | POA: Diagnosis not present

## 2021-09-07 DIAGNOSIS — H5203 Hypermetropia, bilateral: Secondary | ICD-10-CM | POA: Diagnosis not present

## 2021-09-08 DIAGNOSIS — J301 Allergic rhinitis due to pollen: Secondary | ICD-10-CM | POA: Diagnosis not present

## 2021-09-08 DIAGNOSIS — J3089 Other allergic rhinitis: Secondary | ICD-10-CM | POA: Diagnosis not present

## 2021-09-08 DIAGNOSIS — J3081 Allergic rhinitis due to animal (cat) (dog) hair and dander: Secondary | ICD-10-CM | POA: Diagnosis not present

## 2021-09-15 DIAGNOSIS — J3089 Other allergic rhinitis: Secondary | ICD-10-CM | POA: Diagnosis not present

## 2021-09-15 DIAGNOSIS — J3081 Allergic rhinitis due to animal (cat) (dog) hair and dander: Secondary | ICD-10-CM | POA: Diagnosis not present

## 2021-09-15 DIAGNOSIS — J301 Allergic rhinitis due to pollen: Secondary | ICD-10-CM | POA: Diagnosis not present

## 2021-09-29 ENCOUNTER — Other Ambulatory Visit (HOSPITAL_BASED_OUTPATIENT_CLINIC_OR_DEPARTMENT_OTHER): Payer: Self-pay

## 2021-09-29 DIAGNOSIS — J3081 Allergic rhinitis due to animal (cat) (dog) hair and dander: Secondary | ICD-10-CM | POA: Diagnosis not present

## 2021-09-29 DIAGNOSIS — J301 Allergic rhinitis due to pollen: Secondary | ICD-10-CM | POA: Diagnosis not present

## 2021-09-29 DIAGNOSIS — J3089 Other allergic rhinitis: Secondary | ICD-10-CM | POA: Diagnosis not present

## 2021-10-03 DIAGNOSIS — G4733 Obstructive sleep apnea (adult) (pediatric): Secondary | ICD-10-CM | POA: Diagnosis not present

## 2021-10-03 DIAGNOSIS — G4700 Insomnia, unspecified: Secondary | ICD-10-CM | POA: Diagnosis not present

## 2021-10-04 ENCOUNTER — Other Ambulatory Visit (HOSPITAL_BASED_OUTPATIENT_CLINIC_OR_DEPARTMENT_OTHER): Payer: Self-pay

## 2021-10-04 ENCOUNTER — Encounter (HOSPITAL_BASED_OUTPATIENT_CLINIC_OR_DEPARTMENT_OTHER): Payer: Self-pay | Admitting: Pharmacist

## 2021-10-04 DIAGNOSIS — G4733 Obstructive sleep apnea (adult) (pediatric): Secondary | ICD-10-CM | POA: Diagnosis not present

## 2021-10-04 MED ORDER — ALLOPURINOL 100 MG PO TABS
ORAL_TABLET | ORAL | 1 refills | Status: DC
  Filled 2021-10-04 – 2021-10-07 (×2): qty 180, 90d supply, fill #0
  Filled 2022-01-16: qty 180, 90d supply, fill #1

## 2021-10-04 MED ORDER — TAMSULOSIN HCL 0.4 MG PO CAPS
ORAL_CAPSULE | ORAL | 4 refills | Status: DC
  Filled 2021-10-04: qty 90, 90d supply, fill #0

## 2021-10-04 MED ORDER — LISINOPRIL 10 MG PO TABS
ORAL_TABLET | ORAL | 1 refills | Status: DC
  Filled 2021-10-04 – 2021-12-30 (×2): qty 90, 90d supply, fill #0
  Filled 2022-03-29: qty 90, 90d supply, fill #1

## 2021-10-04 MED ORDER — TAMSULOSIN HCL 0.4 MG PO CAPS
ORAL_CAPSULE | ORAL | 4 refills | Status: DC
  Filled 2021-12-03: qty 90, 90d supply, fill #0
  Filled 2022-02-28: qty 90, 90d supply, fill #1
  Filled 2022-05-29: qty 90, 90d supply, fill #2

## 2021-10-06 ENCOUNTER — Other Ambulatory Visit (HOSPITAL_BASED_OUTPATIENT_CLINIC_OR_DEPARTMENT_OTHER): Payer: Self-pay

## 2021-10-06 DIAGNOSIS — J3081 Allergic rhinitis due to animal (cat) (dog) hair and dander: Secondary | ICD-10-CM | POA: Diagnosis not present

## 2021-10-06 DIAGNOSIS — J3089 Other allergic rhinitis: Secondary | ICD-10-CM | POA: Diagnosis not present

## 2021-10-06 DIAGNOSIS — J301 Allergic rhinitis due to pollen: Secondary | ICD-10-CM | POA: Diagnosis not present

## 2021-10-07 ENCOUNTER — Other Ambulatory Visit (HOSPITAL_BASED_OUTPATIENT_CLINIC_OR_DEPARTMENT_OTHER): Payer: Self-pay

## 2021-10-07 MED ORDER — PEG 3350-KCL-NA BICARB-NACL 420 G PO SOLR
ORAL | 0 refills | Status: DC
  Filled 2021-10-07: qty 4000, 1d supply, fill #0

## 2021-10-10 DIAGNOSIS — D2239 Melanocytic nevi of other parts of face: Secondary | ICD-10-CM | POA: Diagnosis not present

## 2021-10-10 DIAGNOSIS — L821 Other seborrheic keratosis: Secondary | ICD-10-CM | POA: Diagnosis not present

## 2021-10-10 DIAGNOSIS — D2261 Melanocytic nevi of right upper limb, including shoulder: Secondary | ICD-10-CM | POA: Diagnosis not present

## 2021-10-10 DIAGNOSIS — L918 Other hypertrophic disorders of the skin: Secondary | ICD-10-CM | POA: Diagnosis not present

## 2021-10-11 ENCOUNTER — Other Ambulatory Visit (HOSPITAL_BASED_OUTPATIENT_CLINIC_OR_DEPARTMENT_OTHER): Payer: Self-pay

## 2021-10-13 DIAGNOSIS — J301 Allergic rhinitis due to pollen: Secondary | ICD-10-CM | POA: Diagnosis not present

## 2021-10-13 DIAGNOSIS — J3089 Other allergic rhinitis: Secondary | ICD-10-CM | POA: Diagnosis not present

## 2021-10-13 DIAGNOSIS — J3081 Allergic rhinitis due to animal (cat) (dog) hair and dander: Secondary | ICD-10-CM | POA: Diagnosis not present

## 2021-10-18 DIAGNOSIS — D128 Benign neoplasm of rectum: Secondary | ICD-10-CM | POA: Diagnosis not present

## 2021-10-18 DIAGNOSIS — D123 Benign neoplasm of transverse colon: Secondary | ICD-10-CM | POA: Diagnosis not present

## 2021-10-18 DIAGNOSIS — Z86010 Personal history of colonic polyps: Secondary | ICD-10-CM | POA: Diagnosis not present

## 2021-10-18 DIAGNOSIS — K3189 Other diseases of stomach and duodenum: Secondary | ICD-10-CM | POA: Diagnosis not present

## 2021-10-18 DIAGNOSIS — K219 Gastro-esophageal reflux disease without esophagitis: Secondary | ICD-10-CM | POA: Diagnosis not present

## 2021-10-18 DIAGNOSIS — K449 Diaphragmatic hernia without obstruction or gangrene: Secondary | ICD-10-CM | POA: Diagnosis not present

## 2021-10-18 DIAGNOSIS — D125 Benign neoplasm of sigmoid colon: Secondary | ICD-10-CM | POA: Diagnosis not present

## 2021-10-20 DIAGNOSIS — J3081 Allergic rhinitis due to animal (cat) (dog) hair and dander: Secondary | ICD-10-CM | POA: Diagnosis not present

## 2021-10-20 DIAGNOSIS — J301 Allergic rhinitis due to pollen: Secondary | ICD-10-CM | POA: Diagnosis not present

## 2021-10-20 DIAGNOSIS — D123 Benign neoplasm of transverse colon: Secondary | ICD-10-CM | POA: Diagnosis not present

## 2021-10-20 DIAGNOSIS — J3089 Other allergic rhinitis: Secondary | ICD-10-CM | POA: Diagnosis not present

## 2021-10-27 DIAGNOSIS — J3081 Allergic rhinitis due to animal (cat) (dog) hair and dander: Secondary | ICD-10-CM | POA: Diagnosis not present

## 2021-10-27 DIAGNOSIS — J301 Allergic rhinitis due to pollen: Secondary | ICD-10-CM | POA: Diagnosis not present

## 2021-10-27 DIAGNOSIS — J3089 Other allergic rhinitis: Secondary | ICD-10-CM | POA: Diagnosis not present

## 2021-10-31 ENCOUNTER — Other Ambulatory Visit (HOSPITAL_BASED_OUTPATIENT_CLINIC_OR_DEPARTMENT_OTHER): Payer: Self-pay

## 2021-11-03 DIAGNOSIS — J3081 Allergic rhinitis due to animal (cat) (dog) hair and dander: Secondary | ICD-10-CM | POA: Diagnosis not present

## 2021-11-03 DIAGNOSIS — J301 Allergic rhinitis due to pollen: Secondary | ICD-10-CM | POA: Diagnosis not present

## 2021-11-03 DIAGNOSIS — J3089 Other allergic rhinitis: Secondary | ICD-10-CM | POA: Diagnosis not present

## 2021-11-04 DIAGNOSIS — G4733 Obstructive sleep apnea (adult) (pediatric): Secondary | ICD-10-CM | POA: Diagnosis not present

## 2021-11-10 DIAGNOSIS — J301 Allergic rhinitis due to pollen: Secondary | ICD-10-CM | POA: Diagnosis not present

## 2021-11-10 DIAGNOSIS — J3089 Other allergic rhinitis: Secondary | ICD-10-CM | POA: Diagnosis not present

## 2021-11-10 DIAGNOSIS — J3081 Allergic rhinitis due to animal (cat) (dog) hair and dander: Secondary | ICD-10-CM | POA: Diagnosis not present

## 2021-11-17 DIAGNOSIS — J3081 Allergic rhinitis due to animal (cat) (dog) hair and dander: Secondary | ICD-10-CM | POA: Diagnosis not present

## 2021-11-17 DIAGNOSIS — J3089 Other allergic rhinitis: Secondary | ICD-10-CM | POA: Diagnosis not present

## 2021-11-17 DIAGNOSIS — J301 Allergic rhinitis due to pollen: Secondary | ICD-10-CM | POA: Diagnosis not present

## 2021-11-24 DIAGNOSIS — J3081 Allergic rhinitis due to animal (cat) (dog) hair and dander: Secondary | ICD-10-CM | POA: Diagnosis not present

## 2021-11-24 DIAGNOSIS — J3089 Other allergic rhinitis: Secondary | ICD-10-CM | POA: Diagnosis not present

## 2021-11-24 DIAGNOSIS — J301 Allergic rhinitis due to pollen: Secondary | ICD-10-CM | POA: Diagnosis not present

## 2021-12-01 DIAGNOSIS — J3089 Other allergic rhinitis: Secondary | ICD-10-CM | POA: Diagnosis not present

## 2021-12-01 DIAGNOSIS — J301 Allergic rhinitis due to pollen: Secondary | ICD-10-CM | POA: Diagnosis not present

## 2021-12-01 DIAGNOSIS — J3081 Allergic rhinitis due to animal (cat) (dog) hair and dander: Secondary | ICD-10-CM | POA: Diagnosis not present

## 2021-12-05 ENCOUNTER — Other Ambulatory Visit (HOSPITAL_BASED_OUTPATIENT_CLINIC_OR_DEPARTMENT_OTHER): Payer: Self-pay

## 2021-12-07 ENCOUNTER — Other Ambulatory Visit (HOSPITAL_BASED_OUTPATIENT_CLINIC_OR_DEPARTMENT_OTHER): Payer: Self-pay

## 2021-12-08 DIAGNOSIS — J301 Allergic rhinitis due to pollen: Secondary | ICD-10-CM | POA: Diagnosis not present

## 2021-12-08 DIAGNOSIS — J3089 Other allergic rhinitis: Secondary | ICD-10-CM | POA: Diagnosis not present

## 2021-12-08 DIAGNOSIS — J3081 Allergic rhinitis due to animal (cat) (dog) hair and dander: Secondary | ICD-10-CM | POA: Diagnosis not present

## 2021-12-15 DIAGNOSIS — J3089 Other allergic rhinitis: Secondary | ICD-10-CM | POA: Diagnosis not present

## 2021-12-15 DIAGNOSIS — J301 Allergic rhinitis due to pollen: Secondary | ICD-10-CM | POA: Diagnosis not present

## 2021-12-15 DIAGNOSIS — J3081 Allergic rhinitis due to animal (cat) (dog) hair and dander: Secondary | ICD-10-CM | POA: Diagnosis not present

## 2021-12-21 ENCOUNTER — Other Ambulatory Visit (HOSPITAL_BASED_OUTPATIENT_CLINIC_OR_DEPARTMENT_OTHER): Payer: Self-pay

## 2021-12-21 DIAGNOSIS — N1831 Chronic kidney disease, stage 3a: Secondary | ICD-10-CM | POA: Diagnosis not present

## 2021-12-21 DIAGNOSIS — K760 Fatty (change of) liver, not elsewhere classified: Secondary | ICD-10-CM | POA: Diagnosis not present

## 2021-12-21 DIAGNOSIS — I10 Essential (primary) hypertension: Secondary | ICD-10-CM | POA: Diagnosis not present

## 2021-12-21 DIAGNOSIS — E875 Hyperkalemia: Secondary | ICD-10-CM | POA: Diagnosis not present

## 2021-12-21 DIAGNOSIS — R809 Proteinuria, unspecified: Secondary | ICD-10-CM | POA: Diagnosis not present

## 2021-12-21 MED ORDER — PRAVASTATIN SODIUM 10 MG PO TABS
ORAL_TABLET | ORAL | 3 refills | Status: DC
  Filled 2021-12-21: qty 45, 90d supply, fill #0
  Filled 2022-03-18: qty 45, 90d supply, fill #1
  Filled 2022-06-16: qty 45, 90d supply, fill #2

## 2021-12-22 DIAGNOSIS — J3089 Other allergic rhinitis: Secondary | ICD-10-CM | POA: Diagnosis not present

## 2021-12-22 DIAGNOSIS — J301 Allergic rhinitis due to pollen: Secondary | ICD-10-CM | POA: Diagnosis not present

## 2021-12-22 DIAGNOSIS — J3081 Allergic rhinitis due to animal (cat) (dog) hair and dander: Secondary | ICD-10-CM | POA: Diagnosis not present

## 2021-12-23 ENCOUNTER — Other Ambulatory Visit (HOSPITAL_BASED_OUTPATIENT_CLINIC_OR_DEPARTMENT_OTHER): Payer: Self-pay

## 2021-12-29 DIAGNOSIS — J301 Allergic rhinitis due to pollen: Secondary | ICD-10-CM | POA: Diagnosis not present

## 2021-12-29 DIAGNOSIS — J3089 Other allergic rhinitis: Secondary | ICD-10-CM | POA: Diagnosis not present

## 2021-12-29 DIAGNOSIS — J3081 Allergic rhinitis due to animal (cat) (dog) hair and dander: Secondary | ICD-10-CM | POA: Diagnosis not present

## 2021-12-30 ENCOUNTER — Other Ambulatory Visit (HOSPITAL_BASED_OUTPATIENT_CLINIC_OR_DEPARTMENT_OTHER): Payer: Self-pay

## 2022-01-05 DIAGNOSIS — J3089 Other allergic rhinitis: Secondary | ICD-10-CM | POA: Diagnosis not present

## 2022-01-05 DIAGNOSIS — J301 Allergic rhinitis due to pollen: Secondary | ICD-10-CM | POA: Diagnosis not present

## 2022-01-05 DIAGNOSIS — J3081 Allergic rhinitis due to animal (cat) (dog) hair and dander: Secondary | ICD-10-CM | POA: Diagnosis not present

## 2022-01-13 DIAGNOSIS — J3089 Other allergic rhinitis: Secondary | ICD-10-CM | POA: Diagnosis not present

## 2022-01-13 DIAGNOSIS — J301 Allergic rhinitis due to pollen: Secondary | ICD-10-CM | POA: Diagnosis not present

## 2022-01-17 ENCOUNTER — Other Ambulatory Visit (HOSPITAL_BASED_OUTPATIENT_CLINIC_OR_DEPARTMENT_OTHER): Payer: Self-pay

## 2022-01-18 DIAGNOSIS — J3081 Allergic rhinitis due to animal (cat) (dog) hair and dander: Secondary | ICD-10-CM | POA: Diagnosis not present

## 2022-01-18 DIAGNOSIS — J3089 Other allergic rhinitis: Secondary | ICD-10-CM | POA: Diagnosis not present

## 2022-01-19 DIAGNOSIS — J3081 Allergic rhinitis due to animal (cat) (dog) hair and dander: Secondary | ICD-10-CM | POA: Diagnosis not present

## 2022-01-19 DIAGNOSIS — J3089 Other allergic rhinitis: Secondary | ICD-10-CM | POA: Diagnosis not present

## 2022-01-19 DIAGNOSIS — J301 Allergic rhinitis due to pollen: Secondary | ICD-10-CM | POA: Diagnosis not present

## 2022-01-26 DIAGNOSIS — J3089 Other allergic rhinitis: Secondary | ICD-10-CM | POA: Diagnosis not present

## 2022-01-26 DIAGNOSIS — J301 Allergic rhinitis due to pollen: Secondary | ICD-10-CM | POA: Diagnosis not present

## 2022-01-26 DIAGNOSIS — J3081 Allergic rhinitis due to animal (cat) (dog) hair and dander: Secondary | ICD-10-CM | POA: Diagnosis not present

## 2022-02-08 DIAGNOSIS — J3081 Allergic rhinitis due to animal (cat) (dog) hair and dander: Secondary | ICD-10-CM | POA: Diagnosis not present

## 2022-02-08 DIAGNOSIS — J3089 Other allergic rhinitis: Secondary | ICD-10-CM | POA: Diagnosis not present

## 2022-02-08 DIAGNOSIS — J301 Allergic rhinitis due to pollen: Secondary | ICD-10-CM | POA: Diagnosis not present

## 2022-02-15 DIAGNOSIS — J301 Allergic rhinitis due to pollen: Secondary | ICD-10-CM | POA: Diagnosis not present

## 2022-02-15 DIAGNOSIS — J3081 Allergic rhinitis due to animal (cat) (dog) hair and dander: Secondary | ICD-10-CM | POA: Diagnosis not present

## 2022-02-15 DIAGNOSIS — J3089 Other allergic rhinitis: Secondary | ICD-10-CM | POA: Diagnosis not present

## 2022-02-21 ENCOUNTER — Other Ambulatory Visit (HOSPITAL_BASED_OUTPATIENT_CLINIC_OR_DEPARTMENT_OTHER): Payer: Self-pay

## 2022-02-21 MED ORDER — EPINEPHRINE 0.3 MG/0.3ML IJ SOAJ
INTRAMUSCULAR | 1 refills | Status: AC
  Filled 2022-02-21: qty 2, 1d supply, fill #0

## 2022-02-23 DIAGNOSIS — J3081 Allergic rhinitis due to animal (cat) (dog) hair and dander: Secondary | ICD-10-CM | POA: Diagnosis not present

## 2022-02-23 DIAGNOSIS — J3089 Other allergic rhinitis: Secondary | ICD-10-CM | POA: Diagnosis not present

## 2022-02-23 DIAGNOSIS — J301 Allergic rhinitis due to pollen: Secondary | ICD-10-CM | POA: Diagnosis not present

## 2022-02-28 ENCOUNTER — Other Ambulatory Visit (HOSPITAL_BASED_OUTPATIENT_CLINIC_OR_DEPARTMENT_OTHER): Payer: Self-pay

## 2022-03-02 DIAGNOSIS — J3089 Other allergic rhinitis: Secondary | ICD-10-CM | POA: Diagnosis not present

## 2022-03-02 DIAGNOSIS — J301 Allergic rhinitis due to pollen: Secondary | ICD-10-CM | POA: Diagnosis not present

## 2022-03-02 DIAGNOSIS — J3081 Allergic rhinitis due to animal (cat) (dog) hair and dander: Secondary | ICD-10-CM | POA: Diagnosis not present

## 2022-03-10 DIAGNOSIS — J3081 Allergic rhinitis due to animal (cat) (dog) hair and dander: Secondary | ICD-10-CM | POA: Diagnosis not present

## 2022-03-10 DIAGNOSIS — J3089 Other allergic rhinitis: Secondary | ICD-10-CM | POA: Diagnosis not present

## 2022-03-10 DIAGNOSIS — J301 Allergic rhinitis due to pollen: Secondary | ICD-10-CM | POA: Diagnosis not present

## 2022-03-17 DIAGNOSIS — J301 Allergic rhinitis due to pollen: Secondary | ICD-10-CM | POA: Diagnosis not present

## 2022-03-17 DIAGNOSIS — J3081 Allergic rhinitis due to animal (cat) (dog) hair and dander: Secondary | ICD-10-CM | POA: Diagnosis not present

## 2022-03-17 DIAGNOSIS — J3089 Other allergic rhinitis: Secondary | ICD-10-CM | POA: Diagnosis not present

## 2022-03-20 ENCOUNTER — Other Ambulatory Visit (HOSPITAL_BASED_OUTPATIENT_CLINIC_OR_DEPARTMENT_OTHER): Payer: Self-pay

## 2022-03-24 DIAGNOSIS — J3081 Allergic rhinitis due to animal (cat) (dog) hair and dander: Secondary | ICD-10-CM | POA: Diagnosis not present

## 2022-03-24 DIAGNOSIS — J301 Allergic rhinitis due to pollen: Secondary | ICD-10-CM | POA: Diagnosis not present

## 2022-03-24 DIAGNOSIS — J3089 Other allergic rhinitis: Secondary | ICD-10-CM | POA: Diagnosis not present

## 2022-03-29 ENCOUNTER — Other Ambulatory Visit (HOSPITAL_BASED_OUTPATIENT_CLINIC_OR_DEPARTMENT_OTHER): Payer: Self-pay

## 2022-03-31 DIAGNOSIS — J301 Allergic rhinitis due to pollen: Secondary | ICD-10-CM | POA: Diagnosis not present

## 2022-03-31 DIAGNOSIS — J3081 Allergic rhinitis due to animal (cat) (dog) hair and dander: Secondary | ICD-10-CM | POA: Diagnosis not present

## 2022-03-31 DIAGNOSIS — J3089 Other allergic rhinitis: Secondary | ICD-10-CM | POA: Diagnosis not present

## 2022-04-07 DIAGNOSIS — J3081 Allergic rhinitis due to animal (cat) (dog) hair and dander: Secondary | ICD-10-CM | POA: Diagnosis not present

## 2022-04-07 DIAGNOSIS — J3089 Other allergic rhinitis: Secondary | ICD-10-CM | POA: Diagnosis not present

## 2022-04-07 DIAGNOSIS — J301 Allergic rhinitis due to pollen: Secondary | ICD-10-CM | POA: Diagnosis not present

## 2022-04-15 ENCOUNTER — Other Ambulatory Visit (HOSPITAL_BASED_OUTPATIENT_CLINIC_OR_DEPARTMENT_OTHER): Payer: Self-pay

## 2022-04-17 ENCOUNTER — Other Ambulatory Visit (HOSPITAL_BASED_OUTPATIENT_CLINIC_OR_DEPARTMENT_OTHER): Payer: Self-pay

## 2022-04-18 ENCOUNTER — Other Ambulatory Visit (HOSPITAL_BASED_OUTPATIENT_CLINIC_OR_DEPARTMENT_OTHER): Payer: Self-pay

## 2022-04-18 MED ORDER — ALLOPURINOL 100 MG PO TABS
200.0000 mg | ORAL_TABLET | Freq: Every day | ORAL | 1 refills | Status: DC
Start: 2022-04-18 — End: 2023-04-11
  Filled 2022-04-18: qty 157, 78d supply, fill #0
  Filled 2022-04-18: qty 23, 12d supply, fill #0
  Filled 2022-07-08: qty 180, 90d supply, fill #1

## 2022-04-21 DIAGNOSIS — J3089 Other allergic rhinitis: Secondary | ICD-10-CM | POA: Diagnosis not present

## 2022-04-21 DIAGNOSIS — J3081 Allergic rhinitis due to animal (cat) (dog) hair and dander: Secondary | ICD-10-CM | POA: Diagnosis not present

## 2022-04-21 DIAGNOSIS — J301 Allergic rhinitis due to pollen: Secondary | ICD-10-CM | POA: Diagnosis not present

## 2022-05-02 DIAGNOSIS — J301 Allergic rhinitis due to pollen: Secondary | ICD-10-CM | POA: Diagnosis not present

## 2022-05-02 DIAGNOSIS — J3081 Allergic rhinitis due to animal (cat) (dog) hair and dander: Secondary | ICD-10-CM | POA: Diagnosis not present

## 2022-05-02 DIAGNOSIS — J3089 Other allergic rhinitis: Secondary | ICD-10-CM | POA: Diagnosis not present

## 2022-05-12 DIAGNOSIS — J3089 Other allergic rhinitis: Secondary | ICD-10-CM | POA: Diagnosis not present

## 2022-05-12 DIAGNOSIS — J3081 Allergic rhinitis due to animal (cat) (dog) hair and dander: Secondary | ICD-10-CM | POA: Diagnosis not present

## 2022-05-12 DIAGNOSIS — J301 Allergic rhinitis due to pollen: Secondary | ICD-10-CM | POA: Diagnosis not present

## 2022-05-16 DIAGNOSIS — J301 Allergic rhinitis due to pollen: Secondary | ICD-10-CM | POA: Diagnosis not present

## 2022-05-19 DIAGNOSIS — J301 Allergic rhinitis due to pollen: Secondary | ICD-10-CM | POA: Diagnosis not present

## 2022-05-19 DIAGNOSIS — J3089 Other allergic rhinitis: Secondary | ICD-10-CM | POA: Diagnosis not present

## 2022-05-19 DIAGNOSIS — J3081 Allergic rhinitis due to animal (cat) (dog) hair and dander: Secondary | ICD-10-CM | POA: Diagnosis not present

## 2022-05-26 DIAGNOSIS — J3081 Allergic rhinitis due to animal (cat) (dog) hair and dander: Secondary | ICD-10-CM | POA: Diagnosis not present

## 2022-05-26 DIAGNOSIS — J3089 Other allergic rhinitis: Secondary | ICD-10-CM | POA: Diagnosis not present

## 2022-05-26 DIAGNOSIS — J301 Allergic rhinitis due to pollen: Secondary | ICD-10-CM | POA: Diagnosis not present

## 2022-05-29 ENCOUNTER — Other Ambulatory Visit (HOSPITAL_BASED_OUTPATIENT_CLINIC_OR_DEPARTMENT_OTHER): Payer: Self-pay

## 2022-06-02 DIAGNOSIS — J301 Allergic rhinitis due to pollen: Secondary | ICD-10-CM | POA: Diagnosis not present

## 2022-06-02 DIAGNOSIS — T148XXA Other injury of unspecified body region, initial encounter: Secondary | ICD-10-CM | POA: Diagnosis not present

## 2022-06-02 DIAGNOSIS — Z0000 Encounter for general adult medical examination without abnormal findings: Secondary | ICD-10-CM | POA: Diagnosis not present

## 2022-06-02 DIAGNOSIS — I10 Essential (primary) hypertension: Secondary | ICD-10-CM | POA: Diagnosis not present

## 2022-06-02 DIAGNOSIS — Z125 Encounter for screening for malignant neoplasm of prostate: Secondary | ICD-10-CM | POA: Diagnosis not present

## 2022-06-02 DIAGNOSIS — J3081 Allergic rhinitis due to animal (cat) (dog) hair and dander: Secondary | ICD-10-CM | POA: Diagnosis not present

## 2022-06-02 DIAGNOSIS — J3089 Other allergic rhinitis: Secondary | ICD-10-CM | POA: Diagnosis not present

## 2022-06-07 DIAGNOSIS — I10 Essential (primary) hypertension: Secondary | ICD-10-CM | POA: Diagnosis not present

## 2022-06-08 ENCOUNTER — Other Ambulatory Visit (HOSPITAL_BASED_OUTPATIENT_CLINIC_OR_DEPARTMENT_OTHER): Payer: Self-pay

## 2022-06-08 MED ORDER — INFLUENZA VAC SPLIT QUAD 0.5 ML IM SUSY
PREFILLED_SYRINGE | INTRAMUSCULAR | 0 refills | Status: DC
  Filled 2022-06-08: qty 0.5, 1d supply, fill #0

## 2022-06-09 DIAGNOSIS — J3089 Other allergic rhinitis: Secondary | ICD-10-CM | POA: Diagnosis not present

## 2022-06-09 DIAGNOSIS — J3081 Allergic rhinitis due to animal (cat) (dog) hair and dander: Secondary | ICD-10-CM | POA: Diagnosis not present

## 2022-06-09 DIAGNOSIS — J301 Allergic rhinitis due to pollen: Secondary | ICD-10-CM | POA: Diagnosis not present

## 2022-06-16 ENCOUNTER — Other Ambulatory Visit (HOSPITAL_BASED_OUTPATIENT_CLINIC_OR_DEPARTMENT_OTHER): Payer: Self-pay

## 2022-06-16 DIAGNOSIS — J3089 Other allergic rhinitis: Secondary | ICD-10-CM | POA: Diagnosis not present

## 2022-06-16 DIAGNOSIS — J301 Allergic rhinitis due to pollen: Secondary | ICD-10-CM | POA: Diagnosis not present

## 2022-06-16 DIAGNOSIS — J3081 Allergic rhinitis due to animal (cat) (dog) hair and dander: Secondary | ICD-10-CM | POA: Diagnosis not present

## 2022-06-19 DIAGNOSIS — Z125 Encounter for screening for malignant neoplasm of prostate: Secondary | ICD-10-CM | POA: Diagnosis not present

## 2022-06-19 DIAGNOSIS — N1831 Chronic kidney disease, stage 3a: Secondary | ICD-10-CM | POA: Diagnosis not present

## 2022-06-19 DIAGNOSIS — Z0000 Encounter for general adult medical examination without abnormal findings: Secondary | ICD-10-CM | POA: Diagnosis not present

## 2022-06-19 DIAGNOSIS — K760 Fatty (change of) liver, not elsewhere classified: Secondary | ICD-10-CM | POA: Diagnosis not present

## 2022-06-19 DIAGNOSIS — R809 Proteinuria, unspecified: Secondary | ICD-10-CM | POA: Diagnosis not present

## 2022-06-19 DIAGNOSIS — I10 Essential (primary) hypertension: Secondary | ICD-10-CM | POA: Diagnosis not present

## 2022-06-19 DIAGNOSIS — Z6827 Body mass index (BMI) 27.0-27.9, adult: Secondary | ICD-10-CM | POA: Diagnosis not present

## 2022-06-20 DIAGNOSIS — I10 Essential (primary) hypertension: Secondary | ICD-10-CM | POA: Diagnosis not present

## 2022-06-23 DIAGNOSIS — J3081 Allergic rhinitis due to animal (cat) (dog) hair and dander: Secondary | ICD-10-CM | POA: Diagnosis not present

## 2022-06-23 DIAGNOSIS — J3089 Other allergic rhinitis: Secondary | ICD-10-CM | POA: Diagnosis not present

## 2022-06-23 DIAGNOSIS — J301 Allergic rhinitis due to pollen: Secondary | ICD-10-CM | POA: Diagnosis not present

## 2022-06-25 ENCOUNTER — Other Ambulatory Visit (HOSPITAL_BASED_OUTPATIENT_CLINIC_OR_DEPARTMENT_OTHER): Payer: Self-pay

## 2022-06-26 ENCOUNTER — Other Ambulatory Visit (HOSPITAL_BASED_OUTPATIENT_CLINIC_OR_DEPARTMENT_OTHER): Payer: Self-pay

## 2022-06-27 ENCOUNTER — Other Ambulatory Visit (HOSPITAL_BASED_OUTPATIENT_CLINIC_OR_DEPARTMENT_OTHER): Payer: Self-pay

## 2022-06-27 MED ORDER — LISINOPRIL 10 MG PO TABS
10.0000 mg | ORAL_TABLET | Freq: Every day | ORAL | 1 refills | Status: DC
  Filled 2022-06-27: qty 90, 90d supply, fill #0

## 2022-07-07 DIAGNOSIS — J3081 Allergic rhinitis due to animal (cat) (dog) hair and dander: Secondary | ICD-10-CM | POA: Diagnosis not present

## 2022-07-07 DIAGNOSIS — J301 Allergic rhinitis due to pollen: Secondary | ICD-10-CM | POA: Diagnosis not present

## 2022-07-07 DIAGNOSIS — J3089 Other allergic rhinitis: Secondary | ICD-10-CM | POA: Diagnosis not present

## 2022-07-08 ENCOUNTER — Other Ambulatory Visit (HOSPITAL_BASED_OUTPATIENT_CLINIC_OR_DEPARTMENT_OTHER): Payer: Self-pay

## 2022-07-14 DIAGNOSIS — J3089 Other allergic rhinitis: Secondary | ICD-10-CM | POA: Diagnosis not present

## 2022-07-14 DIAGNOSIS — J3081 Allergic rhinitis due to animal (cat) (dog) hair and dander: Secondary | ICD-10-CM | POA: Diagnosis not present

## 2022-07-14 DIAGNOSIS — J301 Allergic rhinitis due to pollen: Secondary | ICD-10-CM | POA: Diagnosis not present

## 2022-08-10 DIAGNOSIS — J3089 Other allergic rhinitis: Secondary | ICD-10-CM | POA: Diagnosis not present

## 2022-08-10 DIAGNOSIS — J3081 Allergic rhinitis due to animal (cat) (dog) hair and dander: Secondary | ICD-10-CM | POA: Diagnosis not present

## 2022-08-10 DIAGNOSIS — J301 Allergic rhinitis due to pollen: Secondary | ICD-10-CM | POA: Diagnosis not present

## 2022-08-16 DIAGNOSIS — G4733 Obstructive sleep apnea (adult) (pediatric): Secondary | ICD-10-CM | POA: Diagnosis not present

## 2022-08-16 DIAGNOSIS — J3081 Allergic rhinitis due to animal (cat) (dog) hair and dander: Secondary | ICD-10-CM | POA: Diagnosis not present

## 2022-08-16 DIAGNOSIS — J3089 Other allergic rhinitis: Secondary | ICD-10-CM | POA: Diagnosis not present

## 2022-08-18 DIAGNOSIS — J301 Allergic rhinitis due to pollen: Secondary | ICD-10-CM | POA: Diagnosis not present

## 2022-08-18 DIAGNOSIS — J3081 Allergic rhinitis due to animal (cat) (dog) hair and dander: Secondary | ICD-10-CM | POA: Diagnosis not present

## 2022-08-18 DIAGNOSIS — J3089 Other allergic rhinitis: Secondary | ICD-10-CM | POA: Diagnosis not present

## 2022-08-25 DIAGNOSIS — J301 Allergic rhinitis due to pollen: Secondary | ICD-10-CM | POA: Diagnosis not present

## 2022-08-25 DIAGNOSIS — J3081 Allergic rhinitis due to animal (cat) (dog) hair and dander: Secondary | ICD-10-CM | POA: Diagnosis not present

## 2022-08-25 DIAGNOSIS — J3089 Other allergic rhinitis: Secondary | ICD-10-CM | POA: Diagnosis not present

## 2022-08-31 ENCOUNTER — Other Ambulatory Visit (HOSPITAL_BASED_OUTPATIENT_CLINIC_OR_DEPARTMENT_OTHER): Payer: Self-pay

## 2022-09-01 DIAGNOSIS — J3081 Allergic rhinitis due to animal (cat) (dog) hair and dander: Secondary | ICD-10-CM | POA: Diagnosis not present

## 2022-09-01 DIAGNOSIS — J3089 Other allergic rhinitis: Secondary | ICD-10-CM | POA: Diagnosis not present

## 2022-09-01 DIAGNOSIS — J301 Allergic rhinitis due to pollen: Secondary | ICD-10-CM | POA: Diagnosis not present

## 2022-09-04 ENCOUNTER — Other Ambulatory Visit (HOSPITAL_BASED_OUTPATIENT_CLINIC_OR_DEPARTMENT_OTHER): Payer: Self-pay

## 2022-09-08 DIAGNOSIS — J3089 Other allergic rhinitis: Secondary | ICD-10-CM | POA: Diagnosis not present

## 2022-09-08 DIAGNOSIS — J301 Allergic rhinitis due to pollen: Secondary | ICD-10-CM | POA: Diagnosis not present

## 2022-09-08 DIAGNOSIS — J3081 Allergic rhinitis due to animal (cat) (dog) hair and dander: Secondary | ICD-10-CM | POA: Diagnosis not present

## 2022-09-15 DIAGNOSIS — J3089 Other allergic rhinitis: Secondary | ICD-10-CM | POA: Diagnosis not present

## 2022-09-15 DIAGNOSIS — J301 Allergic rhinitis due to pollen: Secondary | ICD-10-CM | POA: Diagnosis not present

## 2022-09-15 DIAGNOSIS — J3081 Allergic rhinitis due to animal (cat) (dog) hair and dander: Secondary | ICD-10-CM | POA: Diagnosis not present

## 2022-09-16 DIAGNOSIS — G4733 Obstructive sleep apnea (adult) (pediatric): Secondary | ICD-10-CM | POA: Diagnosis not present

## 2022-09-22 DIAGNOSIS — J301 Allergic rhinitis due to pollen: Secondary | ICD-10-CM | POA: Diagnosis not present

## 2022-09-22 DIAGNOSIS — J3089 Other allergic rhinitis: Secondary | ICD-10-CM | POA: Diagnosis not present

## 2022-09-22 DIAGNOSIS — J3081 Allergic rhinitis due to animal (cat) (dog) hair and dander: Secondary | ICD-10-CM | POA: Diagnosis not present

## 2022-09-25 DIAGNOSIS — H52203 Unspecified astigmatism, bilateral: Secondary | ICD-10-CM | POA: Diagnosis not present

## 2022-09-25 DIAGNOSIS — H524 Presbyopia: Secondary | ICD-10-CM | POA: Diagnosis not present

## 2022-09-25 DIAGNOSIS — H2513 Age-related nuclear cataract, bilateral: Secondary | ICD-10-CM | POA: Diagnosis not present

## 2022-09-25 DIAGNOSIS — H35371 Puckering of macula, right eye: Secondary | ICD-10-CM | POA: Diagnosis not present

## 2022-09-25 DIAGNOSIS — H5203 Hypermetropia, bilateral: Secondary | ICD-10-CM | POA: Diagnosis not present

## 2022-09-29 DIAGNOSIS — J301 Allergic rhinitis due to pollen: Secondary | ICD-10-CM | POA: Diagnosis not present

## 2022-09-29 DIAGNOSIS — J3089 Other allergic rhinitis: Secondary | ICD-10-CM | POA: Diagnosis not present

## 2022-09-29 DIAGNOSIS — J3081 Allergic rhinitis due to animal (cat) (dog) hair and dander: Secondary | ICD-10-CM | POA: Diagnosis not present

## 2022-10-04 DIAGNOSIS — G4733 Obstructive sleep apnea (adult) (pediatric): Secondary | ICD-10-CM | POA: Diagnosis not present

## 2022-10-06 DIAGNOSIS — J3081 Allergic rhinitis due to animal (cat) (dog) hair and dander: Secondary | ICD-10-CM | POA: Diagnosis not present

## 2022-10-06 DIAGNOSIS — J3089 Other allergic rhinitis: Secondary | ICD-10-CM | POA: Diagnosis not present

## 2022-10-06 DIAGNOSIS — J301 Allergic rhinitis due to pollen: Secondary | ICD-10-CM | POA: Diagnosis not present

## 2022-10-09 ENCOUNTER — Encounter: Payer: Self-pay | Admitting: Family Medicine

## 2022-10-09 ENCOUNTER — Ambulatory Visit (INDEPENDENT_AMBULATORY_CARE_PROVIDER_SITE_OTHER): Payer: Medicare HMO | Admitting: Family Medicine

## 2022-10-09 VITALS — BP 116/71 | HR 89 | Temp 98.0°F | Resp 18 | Ht 69.0 in | Wt 187.3 lb

## 2022-10-09 DIAGNOSIS — N1831 Chronic kidney disease, stage 3a: Secondary | ICD-10-CM | POA: Diagnosis not present

## 2022-10-09 DIAGNOSIS — J301 Allergic rhinitis due to pollen: Secondary | ICD-10-CM | POA: Insufficient documentation

## 2022-10-09 DIAGNOSIS — Z7689 Persons encountering health services in other specified circumstances: Secondary | ICD-10-CM

## 2022-10-09 DIAGNOSIS — Z23 Encounter for immunization: Secondary | ICD-10-CM | POA: Diagnosis not present

## 2022-10-09 DIAGNOSIS — I10 Essential (primary) hypertension: Secondary | ICD-10-CM

## 2022-10-09 DIAGNOSIS — N183 Chronic kidney disease, stage 3 unspecified: Secondary | ICD-10-CM | POA: Insufficient documentation

## 2022-10-09 NOTE — Progress Notes (Signed)
New Patient Office Visit  Subjective    Patient ID: Derek Kent, male    DOB: 1956-05-11  Age: 67 y.o. MRN: [id number]  CC:  Chief Complaint  Patient presents with   Establish Care    HPI Derek Kent presents to establish care. Pt is new to me.  Pt has hx of gout and taking Allopurinol '100mg'$  2 tabs daily. This helps prevent his gout episodes.  He is taking allergy injections 2 injections a week. He has been taking injections since 1990s. He is using Flonase as needed and Benadryl 1-2 times a day, summer time is worse for him out of the year.  He is using OTC cholesterol medicine. He gets on [place] and has been on this for 4-5 years. He started Pravastatin '10mg'$  every day. He had tried crestor in the past but didn't tolerate.  He had BPH and taking Flomax 0.4 mg daily. He has indigestion and use to be on Pepcid. He has been diagnosed with Hiatal hernia in the past 7 years.  He reports a hx of fatty liver.  He has had colonoscopy last summer. He says the time before that was 2 years ago.  Pt has hx of CKD-3. Seeing nephrologist once a year. Pt reports improvement when started on Lisinopril.  He no longer has proteinuria.  He also sees ophthalmologist every year. He reported a hx of right vision disturbances and has been seeing them for the last several years.  Pt would like PCV-20. He has never received this.  Outpatient Encounter Medications as of 10/09/2022  Medication Sig   allopurinol (ZYLOPRIM) 100 MG tablet Take 2 tablets (200 mg total) by mouth daily.   diphenhydrAMINE (BENADRYL) 25 mg capsule Take 25 mg by mouth every 6 (six) hours as needed for allergies.   EPINEPHrine 0.3 mg/0.3 mL IJ SOAJ injection as directed Injection prn 30 days   fish oil-omega-3 fatty acids 1000 MG capsule Take 2 g by mouth daily.   fluticasone (FLONASE) 50 MCG/ACT nasal spray Place 1 spray into both nostrils daily.   lisinopril (PRINIVIL,ZESTRIL) 10 MG tablet Take 10 mg by mouth daily.    Multiple Vitamins-Minerals (MULTIVITAMIN WITH MINERALS) tablet Take 1 tablet by mouth daily.   pravastatin (PRAVACHOL) 10 MG tablet 1 tablet Orally every other day 90 days   tamsulosin (FLOMAX) 0.4 MG CAPS capsule 1 capsule by mouth Once a day 90 days   VITAMIN D, CHOLECALCIFEROL, PO Take by mouth.   allopurinol (ZYLOPRIM) 100 MG tablet Take 100 mg by mouth daily.   COVID-19 mRNA bivalent vaccine, Pfizer, (PFIZER COVID-19 VAC BIVALENT) injection Inject into the muscle.   famotidine (PEPCID) 10 MG tablet Take 10 mg by mouth 2 (two) times daily. (Patient not taking: Reported on 10/09/2022)   influenza vac split quadrivalent PF (FLUARIX) 0.5 ML injection Inject into the muscle.   lisinopril (ZESTRIL) 10 MG tablet Take 1 tablet (10 mg total) by mouth daily.   [DISCONTINUED] Famotidine (PEPCID PO) Take by mouth.   [DISCONTINUED] polyethylene glycol-electrolytes (NULYTELY) 420 g solution Use as directed   [DISCONTINUED] tamsulosin (FLOMAX) 0.4 MG CAPS capsule Take 1 capsule by mouth once daily.   [DISCONTINUED] TAMSULOSIN HCL PO Take by mouth.   No facility-administered encounter medications on file as of 10/09/2022.    Past Medical History:  Diagnosis Date   Atypical chest pain    normal echo and nuclear stress test 2017   Family history of premature coronary artery disease 11/02/2015   Hypertension  Palpitations    PVC's (premature ventricular contractions) 11/02/2015    Past Surgical History:  Procedure Laterality Date   NO PAST SURGERIES      Family History  Problem Relation Age of Onset   Breast cancer Mother    Heart disease Father    Heart attack Maternal Uncle    Heart attack Paternal Uncle    Heart attack Maternal Grandfather     Social History   Socioeconomic History   Marital status: Married    Spouse name: Not on file   Number of children: Not on file   Years of education: Not on file   Highest education level: Not on file  Occupational History   Not on file   Tobacco Use   Smoking status: Never   Smokeless tobacco: Never  Vaping Use   Vaping Use: Never used  Substance and Sexual Activity   Alcohol use: No   Drug use: No   Sexual activity: Yes    Birth control/protection: None  Other Topics Concern   Not on file  Social History Narrative   Not on file   Social Determinants of Health   Financial Resource Strain: Not on file  Food Insecurity: Not on file  Transportation Needs: Not on file  Physical Activity: Not on file  Stress: Not on file  Social Connections: Not on file  Intimate Partner Violence: Not on file    Review of Systems  All other systems reviewed and are negative.      Objective    BP 116/71   Pulse 89   Temp 98 F (36.7 C) (Oral)   Resp 18   Ht '5\' 9"'$  (1.753 m)   Wt 187 lb 4.8 oz (85 kg)   SpO2 96%   BMI 27.66 kg/m   Physical Exam Vitals and nursing note reviewed.  Constitutional:      Appearance: Normal appearance. He is normal weight.  HENT:     Head: Normocephalic and atraumatic.     Right Ear: External ear normal.     Left Ear: External ear normal.     Nose: Nose normal.     Mouth/Throat:     Mouth: Mucous membranes are moist.  Eyes:     Extraocular Movements: Extraocular movements intact.     Conjunctiva/sclera: Conjunctivae normal.  Cardiovascular:     Rate and Rhythm: Normal rate and regular rhythm.     Pulses: Normal pulses.     Heart sounds: Normal heart sounds.  Pulmonary:     Effort: Pulmonary effort is normal.     Breath sounds: Normal breath sounds.  Abdominal:     General: Abdomen is flat. Bowel sounds are normal.     Palpations: Abdomen is soft.  Skin:    General: Skin is warm.     Capillary Refill: Capillary refill takes less than 2 seconds.  Neurological:     General: No focal deficit present.     Mental Status: He is alert and oriented to person, place, and time. Mental status is at baseline.  Psychiatric:        Mood and Affect: Mood normal.        Behavior:  Behavior normal.        Thought Content: Thought content normal.        Judgment: Judgment normal.       Assessment & Plan:   Problem List Items Addressed This Visit   None  Encounter to establish care with new doctor  Benign  essential HTN   -stable and at goal. Continue Lisinopril 10 mg daily.   Stage 3a chronic kidney disease (HCC)   -stable. Continue routine follow up with Nephrologist. Continue Lisinopril '10mg'$  daily.  Need for vaccination against Streptococcus pneumoniae -     Pneumococcal conjugate vaccine 20-valent   Total time spent with patient today 41 minutes. This includes reviewing records, evaluating the patient and coordinating care. Face-to-face time >50%.   No follow-ups on file.   Leeanne Rio, MD

## 2022-10-13 DIAGNOSIS — J3081 Allergic rhinitis due to animal (cat) (dog) hair and dander: Secondary | ICD-10-CM | POA: Diagnosis not present

## 2022-10-13 DIAGNOSIS — J3089 Other allergic rhinitis: Secondary | ICD-10-CM | POA: Diagnosis not present

## 2022-10-13 DIAGNOSIS — J301 Allergic rhinitis due to pollen: Secondary | ICD-10-CM | POA: Diagnosis not present

## 2022-10-15 DIAGNOSIS — G4733 Obstructive sleep apnea (adult) (pediatric): Secondary | ICD-10-CM | POA: Diagnosis not present

## 2022-10-20 DIAGNOSIS — J3089 Other allergic rhinitis: Secondary | ICD-10-CM | POA: Diagnosis not present

## 2022-10-20 DIAGNOSIS — J3081 Allergic rhinitis due to animal (cat) (dog) hair and dander: Secondary | ICD-10-CM | POA: Diagnosis not present

## 2022-10-20 DIAGNOSIS — J301 Allergic rhinitis due to pollen: Secondary | ICD-10-CM | POA: Diagnosis not present

## 2022-10-27 DIAGNOSIS — J3081 Allergic rhinitis due to animal (cat) (dog) hair and dander: Secondary | ICD-10-CM | POA: Diagnosis not present

## 2022-10-27 DIAGNOSIS — J301 Allergic rhinitis due to pollen: Secondary | ICD-10-CM | POA: Diagnosis not present

## 2022-10-27 DIAGNOSIS — J3089 Other allergic rhinitis: Secondary | ICD-10-CM | POA: Diagnosis not present

## 2022-11-02 DIAGNOSIS — J3081 Allergic rhinitis due to animal (cat) (dog) hair and dander: Secondary | ICD-10-CM | POA: Diagnosis not present

## 2022-11-02 DIAGNOSIS — J3089 Other allergic rhinitis: Secondary | ICD-10-CM | POA: Diagnosis not present

## 2022-11-02 DIAGNOSIS — J301 Allergic rhinitis due to pollen: Secondary | ICD-10-CM | POA: Diagnosis not present

## 2022-11-03 ENCOUNTER — Ambulatory Visit: Payer: Managed Care, Other (non HMO) | Admitting: Family Medicine

## 2022-11-09 DIAGNOSIS — J301 Allergic rhinitis due to pollen: Secondary | ICD-10-CM | POA: Diagnosis not present

## 2022-11-14 ENCOUNTER — Encounter: Payer: Self-pay | Admitting: Family Medicine

## 2022-11-14 ENCOUNTER — Other Ambulatory Visit: Payer: Self-pay | Admitting: Family Medicine

## 2022-11-17 DIAGNOSIS — J3081 Allergic rhinitis due to animal (cat) (dog) hair and dander: Secondary | ICD-10-CM | POA: Diagnosis not present

## 2022-11-17 DIAGNOSIS — J3089 Other allergic rhinitis: Secondary | ICD-10-CM | POA: Diagnosis not present

## 2022-11-17 DIAGNOSIS — J301 Allergic rhinitis due to pollen: Secondary | ICD-10-CM | POA: Diagnosis not present

## 2022-11-24 DIAGNOSIS — J3081 Allergic rhinitis due to animal (cat) (dog) hair and dander: Secondary | ICD-10-CM | POA: Diagnosis not present

## 2022-11-24 DIAGNOSIS — J301 Allergic rhinitis due to pollen: Secondary | ICD-10-CM | POA: Diagnosis not present

## 2022-11-24 DIAGNOSIS — J3089 Other allergic rhinitis: Secondary | ICD-10-CM | POA: Diagnosis not present

## 2022-12-01 DIAGNOSIS — J3089 Other allergic rhinitis: Secondary | ICD-10-CM | POA: Diagnosis not present

## 2022-12-01 DIAGNOSIS — J301 Allergic rhinitis due to pollen: Secondary | ICD-10-CM | POA: Diagnosis not present

## 2022-12-01 DIAGNOSIS — J3081 Allergic rhinitis due to animal (cat) (dog) hair and dander: Secondary | ICD-10-CM | POA: Diagnosis not present

## 2022-12-05 DIAGNOSIS — L821 Other seborrheic keratosis: Secondary | ICD-10-CM | POA: Diagnosis not present

## 2022-12-05 DIAGNOSIS — L570 Actinic keratosis: Secondary | ICD-10-CM | POA: Diagnosis not present

## 2022-12-05 DIAGNOSIS — D225 Melanocytic nevi of trunk: Secondary | ICD-10-CM | POA: Diagnosis not present

## 2022-12-05 DIAGNOSIS — D2261 Melanocytic nevi of right upper limb, including shoulder: Secondary | ICD-10-CM | POA: Diagnosis not present

## 2022-12-05 DIAGNOSIS — L720 Epidermal cyst: Secondary | ICD-10-CM | POA: Diagnosis not present

## 2022-12-05 DIAGNOSIS — D692 Other nonthrombocytopenic purpura: Secondary | ICD-10-CM | POA: Diagnosis not present

## 2022-12-05 DIAGNOSIS — D2271 Melanocytic nevi of right lower limb, including hip: Secondary | ICD-10-CM | POA: Diagnosis not present

## 2022-12-05 DIAGNOSIS — L820 Inflamed seborrheic keratosis: Secondary | ICD-10-CM | POA: Diagnosis not present

## 2022-12-05 DIAGNOSIS — D2272 Melanocytic nevi of left lower limb, including hip: Secondary | ICD-10-CM | POA: Diagnosis not present

## 2022-12-05 DIAGNOSIS — D1801 Hemangioma of skin and subcutaneous tissue: Secondary | ICD-10-CM | POA: Diagnosis not present

## 2022-12-08 DIAGNOSIS — J301 Allergic rhinitis due to pollen: Secondary | ICD-10-CM | POA: Diagnosis not present

## 2022-12-08 DIAGNOSIS — J3089 Other allergic rhinitis: Secondary | ICD-10-CM | POA: Diagnosis not present

## 2022-12-08 DIAGNOSIS — J3081 Allergic rhinitis due to animal (cat) (dog) hair and dander: Secondary | ICD-10-CM | POA: Diagnosis not present

## 2022-12-12 DIAGNOSIS — J3081 Allergic rhinitis due to animal (cat) (dog) hair and dander: Secondary | ICD-10-CM | POA: Diagnosis not present

## 2022-12-12 DIAGNOSIS — J3089 Other allergic rhinitis: Secondary | ICD-10-CM | POA: Diagnosis not present

## 2022-12-15 DIAGNOSIS — J3081 Allergic rhinitis due to animal (cat) (dog) hair and dander: Secondary | ICD-10-CM | POA: Diagnosis not present

## 2022-12-15 DIAGNOSIS — J3089 Other allergic rhinitis: Secondary | ICD-10-CM | POA: Diagnosis not present

## 2022-12-15 DIAGNOSIS — R69 Illness, unspecified: Secondary | ICD-10-CM | POA: Diagnosis not present

## 2022-12-15 DIAGNOSIS — J301 Allergic rhinitis due to pollen: Secondary | ICD-10-CM | POA: Diagnosis not present

## 2022-12-19 DIAGNOSIS — I10 Essential (primary) hypertension: Secondary | ICD-10-CM | POA: Diagnosis not present

## 2022-12-19 DIAGNOSIS — E782 Mixed hyperlipidemia: Secondary | ICD-10-CM | POA: Diagnosis not present

## 2022-12-19 DIAGNOSIS — N1831 Chronic kidney disease, stage 3a: Secondary | ICD-10-CM | POA: Diagnosis not present

## 2022-12-22 DIAGNOSIS — J301 Allergic rhinitis due to pollen: Secondary | ICD-10-CM | POA: Diagnosis not present

## 2022-12-22 DIAGNOSIS — J3089 Other allergic rhinitis: Secondary | ICD-10-CM | POA: Diagnosis not present

## 2022-12-22 DIAGNOSIS — J3081 Allergic rhinitis due to animal (cat) (dog) hair and dander: Secondary | ICD-10-CM | POA: Diagnosis not present

## 2022-12-29 DIAGNOSIS — J3081 Allergic rhinitis due to animal (cat) (dog) hair and dander: Secondary | ICD-10-CM | POA: Diagnosis not present

## 2022-12-29 DIAGNOSIS — J301 Allergic rhinitis due to pollen: Secondary | ICD-10-CM | POA: Diagnosis not present

## 2022-12-29 DIAGNOSIS — J3089 Other allergic rhinitis: Secondary | ICD-10-CM | POA: Diagnosis not present

## 2023-01-09 ENCOUNTER — Telehealth: Payer: Self-pay | Admitting: Family Medicine

## 2023-01-09 NOTE — Telephone Encounter (Signed)
Patient came in to office posing issue. His previous provider and/or pharmacy have been refilling prescriptions and patient has been unable to get them moved over to current PCP. Patient is concerned about the extra refills and is wondering if there is a way we can go ahead and get them moved over to current PCP.   Patient would also like to know if her should go ahead and pick up the refills that have been sent in or if her should wait until it has been put under current provider's name. Please advise. Katha Hamming

## 2023-01-09 NOTE — Telephone Encounter (Signed)
I can go ahead and refill any medicines he needs at this time. Pt can also inform pharmacy to request the change. That way they send the refill request to me instead of his previous provider

## 2023-01-11 NOTE — Telephone Encounter (Signed)
Spoke with patient regarding his prescriptions and refills , patient voiced a verbal understanding

## 2023-01-12 DIAGNOSIS — J301 Allergic rhinitis due to pollen: Secondary | ICD-10-CM | POA: Diagnosis not present

## 2023-01-12 DIAGNOSIS — J3081 Allergic rhinitis due to animal (cat) (dog) hair and dander: Secondary | ICD-10-CM | POA: Diagnosis not present

## 2023-01-12 DIAGNOSIS — J3089 Other allergic rhinitis: Secondary | ICD-10-CM | POA: Diagnosis not present

## 2023-01-19 DIAGNOSIS — J301 Allergic rhinitis due to pollen: Secondary | ICD-10-CM | POA: Diagnosis not present

## 2023-01-19 DIAGNOSIS — J3089 Other allergic rhinitis: Secondary | ICD-10-CM | POA: Diagnosis not present

## 2023-01-19 DIAGNOSIS — J3081 Allergic rhinitis due to animal (cat) (dog) hair and dander: Secondary | ICD-10-CM | POA: Diagnosis not present

## 2023-01-26 DIAGNOSIS — J301 Allergic rhinitis due to pollen: Secondary | ICD-10-CM | POA: Diagnosis not present

## 2023-01-26 DIAGNOSIS — J3081 Allergic rhinitis due to animal (cat) (dog) hair and dander: Secondary | ICD-10-CM | POA: Diagnosis not present

## 2023-01-26 DIAGNOSIS — J3089 Other allergic rhinitis: Secondary | ICD-10-CM | POA: Diagnosis not present

## 2023-02-02 DIAGNOSIS — J301 Allergic rhinitis due to pollen: Secondary | ICD-10-CM | POA: Diagnosis not present

## 2023-02-02 DIAGNOSIS — J3089 Other allergic rhinitis: Secondary | ICD-10-CM | POA: Diagnosis not present

## 2023-02-02 DIAGNOSIS — J3081 Allergic rhinitis due to animal (cat) (dog) hair and dander: Secondary | ICD-10-CM | POA: Diagnosis not present

## 2023-02-16 DIAGNOSIS — J3081 Allergic rhinitis due to animal (cat) (dog) hair and dander: Secondary | ICD-10-CM | POA: Diagnosis not present

## 2023-02-16 DIAGNOSIS — J301 Allergic rhinitis due to pollen: Secondary | ICD-10-CM | POA: Diagnosis not present

## 2023-02-16 DIAGNOSIS — J3089 Other allergic rhinitis: Secondary | ICD-10-CM | POA: Diagnosis not present

## 2023-02-21 DIAGNOSIS — J301 Allergic rhinitis due to pollen: Secondary | ICD-10-CM | POA: Diagnosis not present

## 2023-02-21 DIAGNOSIS — J3089 Other allergic rhinitis: Secondary | ICD-10-CM | POA: Diagnosis not present

## 2023-02-21 DIAGNOSIS — J3081 Allergic rhinitis due to animal (cat) (dog) hair and dander: Secondary | ICD-10-CM | POA: Diagnosis not present

## 2023-03-02 DIAGNOSIS — J3081 Allergic rhinitis due to animal (cat) (dog) hair and dander: Secondary | ICD-10-CM | POA: Diagnosis not present

## 2023-03-02 DIAGNOSIS — J301 Allergic rhinitis due to pollen: Secondary | ICD-10-CM | POA: Diagnosis not present

## 2023-03-02 DIAGNOSIS — J3089 Other allergic rhinitis: Secondary | ICD-10-CM | POA: Diagnosis not present

## 2023-03-09 DIAGNOSIS — J301 Allergic rhinitis due to pollen: Secondary | ICD-10-CM | POA: Diagnosis not present

## 2023-03-09 DIAGNOSIS — J3089 Other allergic rhinitis: Secondary | ICD-10-CM | POA: Diagnosis not present

## 2023-03-09 DIAGNOSIS — J3081 Allergic rhinitis due to animal (cat) (dog) hair and dander: Secondary | ICD-10-CM | POA: Diagnosis not present

## 2023-03-19 ENCOUNTER — Telehealth: Payer: Self-pay | Admitting: Family Medicine

## 2023-03-19 ENCOUNTER — Other Ambulatory Visit: Payer: Self-pay

## 2023-03-19 DIAGNOSIS — N1831 Chronic kidney disease, stage 3a: Secondary | ICD-10-CM

## 2023-03-19 DIAGNOSIS — E785 Hyperlipidemia, unspecified: Secondary | ICD-10-CM

## 2023-03-19 MED ORDER — PRAVASTATIN SODIUM 10 MG PO TABS
ORAL_TABLET | ORAL | 3 refills | Status: DC
Start: 2023-03-19 — End: 2023-03-19

## 2023-03-19 MED ORDER — PRAVASTATIN SODIUM 10 MG PO TABS
ORAL_TABLET | ORAL | 3 refills | Status: DC
Start: 2023-03-19 — End: 2023-03-20

## 2023-03-19 MED ORDER — TAMSULOSIN HCL 0.4 MG PO CAPS: ORAL_CAPSULE | ORAL | 4 refills | Status: DC

## 2023-03-19 MED ORDER — TAMSULOSIN HCL 0.4 MG PO CAPS
ORAL_CAPSULE | ORAL | 4 refills | Status: DC
Start: 2023-03-19 — End: 2023-03-19

## 2023-03-19 NOTE — Telephone Encounter (Signed)
Prescription Request  03/19/2023  LOV: 10/09/2022  What is the name of the medication or equipment?   Needs now: pravastatin (PRAVACHOL) 10 MG tablet, tamsulosin (FLOMAX) 0.4 MG CAPS capsule  Needs later: lisinopril (PRINIVIL,ZESTRIL) 10 MG tablet, allopurinol (ZYLOPRIM) 100 MG tablet (2 daily)  Have you contacted your pharmacy to request a refill? Yes   Which pharmacy would you like this sent to?  [phone] [place], [place] - [address]  [place] [address] Phone: [phone] Fax: [phone]    Patient notified that their request is being sent to the clinical staff for review and that they should receive a response within 2 business days.   Please advise at Mobile [phone] (mobile)

## 2023-03-19 NOTE — Addendum Note (Signed)
Addended by: Nila Nephew on: 03/19/2023 02:02 PM   Modules accepted: Orders

## 2023-03-20 MED ORDER — PRAVASTATIN SODIUM 10 MG PO TABS
10.0000 mg | ORAL_TABLET | Freq: Every day | ORAL | 0 refills | Status: DC
Start: 2023-03-20 — End: 2023-05-21

## 2023-03-20 NOTE — Telephone Encounter (Signed)
pravastatin (PRAVACHOL) 10 MG tablet,  Patient states this prescription filled should be fore every single not (as he has been taking that for the last 6 months) instead of every other day. Katha Hamming        Called pharmacy and pharmacy is aware and script has been changed to state 1 tab  daily . For 30 days with 0 refill    I was informed patient did pick up prescription today around 2:15 with the old script for every other day for 90 days with a quantity of 90.

## 2023-03-20 NOTE — Addendum Note (Signed)
Addended by: Nila Nephew on: 03/20/2023 04:19 PM   Modules accepted: Orders

## 2023-03-20 NOTE — Telephone Encounter (Signed)
pravastatin (PRAVACHOL) 10 MG tablet,  Patient states this prescription filled should be fore every single not (as he has been taking that for the last 6 months) instead of every other day. Katha Hamming

## 2023-03-23 DIAGNOSIS — J3081 Allergic rhinitis due to animal (cat) (dog) hair and dander: Secondary | ICD-10-CM | POA: Diagnosis not present

## 2023-03-23 DIAGNOSIS — J3089 Other allergic rhinitis: Secondary | ICD-10-CM | POA: Diagnosis not present

## 2023-03-23 DIAGNOSIS — J301 Allergic rhinitis due to pollen: Secondary | ICD-10-CM | POA: Diagnosis not present

## 2023-03-25 ENCOUNTER — Ambulatory Visit
Admission: EM | Admit: 2023-03-25 | Discharge: 2023-03-25 | Disposition: A | Discharge status: Home / Self Care | Payer: Medicare HMO | Attending: Physician Assistant | Admitting: Physician Assistant

## 2023-03-25 ENCOUNTER — Other Ambulatory Visit: Payer: Self-pay

## 2023-03-25 DIAGNOSIS — B356 Tinea cruris: Secondary | ICD-10-CM

## 2023-03-25 DIAGNOSIS — L291 Pruritus scroti: Secondary | ICD-10-CM | POA: Diagnosis not present

## 2023-03-25 MED ORDER — HYDROXYZINE HCL 25 MG PO TABS: 25.0000 mg | ORAL_TABLET | Freq: Three times a day (TID) | ORAL | 0 refills | Status: DC | PRN

## 2023-03-25 MED ORDER — FLUCONAZOLE 150 MG PO TABS: 150.0000 mg | ORAL_TABLET | ORAL | 0 refills | Status: AC

## 2023-03-25 MED ORDER — CICLOPIROX OLAMINE 0.77 % EX CREA: TOPICAL_CREAM | Freq: Two times a day (BID) | CUTANEOUS | 0 refills | Status: DC

## 2023-03-25 NOTE — ED Triage Notes (Addendum)
C/o severe itching to scrotum x 4 days, reports has a leathery appearance to scrotum now

## 2023-03-25 NOTE — ED Provider Notes (Signed)
[facility]    CSN: [id number] Arrival date & time: 03/25/23  0803      History   Chief Complaint Chief Complaint  Patient presents with   Pruritis    HPI Derek Kent is a 67 y.o. male.   Patient presents today with a 4-day history of increasing pruritus of his scrotum.  He reports initially he had some jock itch symptoms but treated this with Lotrimin and had improvement of symptoms.  He then developed intense pruritus but without associated rash of the scrotum.  He has since been scratching and has noticed erythema, rash, thickening of the skin.  He denies episodes of similar symptoms in the past.  He denies any history of dermatological condition including eczema or psoriasis.  Reports that over a month ago his wife changed the detergent but this was too a sensitive skin/hypoallergenic formulation.  Denies any additional changes to soaps or detergents.  Denies any recent medication changes.  He has tried Benadryl as well as other medications without improvement of symptoms.  He denies any history of diabetes and does not take SGLT2 inhibitor.  He denies any associated fever, nausea, vomiting, testicular or penile pain, penile discharge.    Past Medical History:  Diagnosis Date   Atypical chest pain    normal echo and nuclear stress test 2017   CKD (chronic kidney disease) stage 3, GFR 30-59 ml/min (HCC)    proteinuria   Family history of premature coronary artery disease 11/02/2015   Fatty liver    Gout    Hiatal hernia    Hypertension    PVC's (premature ventricular contractions) 11/02/2015    Patient Active Problem List   Diagnosis Date Noted   Allergic rhinitis due to pollen 10/09/2022   CKD (chronic kidney disease) stage 3, GFR 30-59 ml/min (HCC) 10/09/2022   Mitral regurgitation 11/01/2016   Family history of premature coronary artery disease 11/02/2015   Benign essential HTN 11/02/2015   PVC's (premature ventricular contractions) 11/02/2015     Past Surgical History:  Procedure Laterality Date   NO PAST SURGERIES         Home Medications    Prior to Admission medications   Medication Sig Start Date End Date Taking? Authorizing Provider  ciclopirox (LOPROX) 0.77 % cream Apply topically 2 (two) times daily. 03/25/23  Yes Martine Trageser K, PA-C  fluconazole (DIFLUCAN) 150 MG tablet Take 1 tablet (150 mg total) by mouth every 3 (three) days for 3 doses. 03/25/23 04/01/23 Yes Dayron Odland, Noberto Retort, PA-C  hydrOXYzine (ATARAX) 25 MG tablet Take 1 tablet (25 mg total) by mouth every 8 (eight) hours as needed for itching. 03/25/23  Yes Hani Campusano, Noberto Retort, PA-C  allopurinol (ZYLOPRIM) 100 MG tablet Take 2 tablets (200 mg total) by mouth daily. 04/18/22     EPINEPHrine 0.3 mg/0.3 mL IJ SOAJ injection as directed Injection prn 30 days 02/21/22     fish oil-omega-3 fatty acids 1000 MG capsule Take 2 g by mouth daily.    [provider]  fluticasone (FLONASE) 50 MCG/ACT nasal spray Place 1 spray into both nostrils daily. 03/28/19   [provider]  lisinopril (PRINIVIL,ZESTRIL) 10 MG tablet Take 10 mg by mouth daily.    [provider]  Multiple Vitamins-Minerals (MULTIVITAMIN WITH MINERALS) tablet Take 1 tablet by mouth daily.    [provider]  pravastatin (PRAVACHOL) 10 MG tablet Take 1 tablet (10 mg total) by mouth daily. 03/20/23   Suzan Slick, MD  tamsulosin (FLOMAX) 0.4 MG CAPS capsule 1 capsule by mouth Once a day 90 days 03/19/23   Suzan Slick, MD  VITAMIN D, CHOLECALCIFEROL, PO Take by mouth.    [provider]    Family History Family History  Problem Relation Age of Onset   Breast cancer Mother    Heart disease Father    Heart attack Maternal Uncle    Heart attack Paternal Uncle    Heart attack Maternal Grandfather     Social History Social History   Tobacco Use   Smoking status: Never   Smokeless tobacco: Never  Vaping Use   Vaping status: Never Used  Substance Use Topics    Alcohol use: No   Drug use: No     Allergies   Ibuprofen   Review of Systems Review of Systems  Constitutional:  Positive for activity change. Negative for appetite change, fatigue and fever.  Gastrointestinal:  Negative for abdominal pain, diarrhea, nausea and vomiting.  Genitourinary:  Negative for dysuria, frequency, penile swelling, scrotal swelling and testicular pain.  Musculoskeletal:  Negative for arthralgias and myalgias.  Skin:  Positive for rash. Negative for color change and wound.     Physical Exam Triage Vital Signs ED Triage Vitals  Encounter Vitals Group     BP 03/25/23 0812 (!) 152/82     Systolic BP Percentile --      Diastolic BP Percentile --      Pulse Rate 03/25/23 0812 99     Resp 03/25/23 0812 16     Temp 03/25/23 0812 97.9 F (36.6 C)     Temp Source 03/25/23 0812 Oral     SpO2 03/25/23 0812 98 %     Weight --      Height --      Head Circumference --      Peak Flow --      Pain Score 03/25/23 0813 8     Pain Loc --      Pain Education --      Exclude from Growth Chart --    No data found.  Updated Vital Signs BP (!) 152/82 (BP Location: Left Arm)   Pulse 99   Temp 97.9 F (36.6 C) (Oral)   Resp 16   SpO2 98%   Visual Acuity Right Eye Distance:   Left Eye Distance:   Bilateral Distance:    Right Eye Near:   Left Eye Near:    Bilateral Near:     Physical Exam Vitals reviewed. Exam conducted with a chaperone present.  Constitutional:      General: He is awake.     Appearance: Normal appearance. He is well-developed. He is not ill-appearing.     Comments: Very pleasant male appears stated age in no acute distress sitting comfortably in exam room  HENT:     Head: Normocephalic and atraumatic.     Mouth/Throat:     Pharynx: No oropharyngeal exudate, posterior oropharyngeal erythema or uvula swelling.  Cardiovascular:     Rate and Rhythm: Normal rate and regular rhythm.     Heart sounds: Normal heart sounds, S1 normal and  S2 normal. No murmur heard. Pulmonary:     Effort: Pulmonary effort is normal.     Breath sounds: Normal breath sounds. No stridor. No wheezing, rhonchi or rales.     Comments: Clear to auscultation bilaterally Genitourinary:    Penis: Normal and circumcised.      Comments: Delice Bison, CMA present as chaperone during exam.  Skin:    Findings: Rash present. Rash is macular and papular.     Comments: Erythematous macular papular rash with associated lichenification over scrotum.  No wounds noted.  No bleeding or drainage noted.  Neurological:     Mental Status: He is alert.  Psychiatric:        Behavior: Behavior is cooperative.      UC Treatments / Results  Labs (all labs ordered are listed, but only abnormal results are displayed) Labs Reviewed - No data to display  EKG   Radiology No results found.  Procedures Procedures (including critical care time)  Medications Ordered in UC Medications - No data to display  Initial Impression / Assessment and Plan / UC Course  I have reviewed the triage vital signs and the nursing notes.  Pertinent labs & imaging results that were available during my care of the patient were reviewed by me and considered in my medical decision making (see chart for details).     Concern for tinea cruris.  Given clinical presentation will treat with systemic therapy and fluconazole to be taken every 72 hours for up to 3 doses were sent to pharmacy.  Will also treat with topical Loprox twice daily for minimum of 2 weeks.  He was encouraged to keep area clean and dry is much as possible.  Prescription for Atarax was sent to pharmacy to help with pruritus and we discussed that he is not to take additional antihistamines with this medication.  Recommended close follow-up with his primary care if symptoms are improving.  We discussed that if he develops any pain, spread of rash, fever, nausea, vomiting he needs to be seen immediately.  Strict return precautions  given.  Final Clinical Impressions(s) / UC Diagnoses   Final diagnoses:  Tinea cruris  Scrotal itching     Discharge Instructions      Apply Loprox twice daily for minimum of 2 weeks.  Keep this area clean with soap and water and allow it to dry.  Take Diflucan every 3 days for up to 3 doses.  I have called in hydroxyzine to help with the itching.  This will make you sleepy so do not drive or drink alcohol with taking it.  You should not combine this with other antihistamines such as Benadryl, Zyrtec, Claritin as it can increase the risk of side effects.  Follow-up with your primary care soon as possible.  If anything worsens and you have spread of rash, associated pain, fever, nausea, vomiting you need to be seen immediately.     ED Prescriptions     Medication Sig Dispense Auth. Provider   ciclopirox (LOPROX) 0.77 % cream Apply topically 2 (two) times daily. 90 g Aldwin Micalizzi K, PA-C   fluconazole (DIFLUCAN) 150 MG tablet Take 1 tablet (150 mg total) by mouth every 3 (three) days for 3 doses. 3 tablet Arrin Ishler K, PA-C   hydrOXYzine (ATARAX) 25 MG tablet Take 1 tablet (25 mg total) by mouth every 8 (eight) hours as needed for itching. 12 tablet Tysen Roesler, Noberto Retort, PA-C      PDMP not reviewed this encounter.   Jeani Hawking, PA-C 03/25/23 1000

## 2023-03-25 NOTE — Discharge Instructions (Signed)
Apply Loprox twice daily for minimum of 2 weeks.  Keep this area clean with soap and water and allow it to dry.  Take Diflucan every 3 days for up to 3 doses.  I have called in hydroxyzine to help with the itching.  This will make you sleepy so do not drive or drink alcohol with taking it.  You should not combine this with other antihistamines such as Benadryl, Zyrtec, Claritin as it can increase the risk of side effects.  Follow-up with your primary care soon as possible.  If anything worsens and you have spread of rash, associated pain, fever, nausea, vomiting you need to be seen immediately.

## 2023-03-27 ENCOUNTER — Other Ambulatory Visit: Payer: Self-pay

## 2023-03-27 ENCOUNTER — Telehealth: Payer: Self-pay

## 2023-03-27 DIAGNOSIS — I10 Essential (primary) hypertension: Secondary | ICD-10-CM

## 2023-03-27 MED ORDER — LISINOPRIL 10 MG PO TABS
10.0000 mg | ORAL_TABLET | Freq: Every day | ORAL | 0 refills | Status: DC
Start: 2023-03-27 — End: 2023-04-19

## 2023-03-27 NOTE — Telephone Encounter (Signed)
Patient called wanting to know if the prescription for (pravastatin 10mg  )was changed to one tab daily versus the old script 1 tab every other day. Informed patient that his pharmacy listed in his chart was called on 08/13 to make changes and that his next refill will reflect changes to directions, Patient also wanted to have the( lisinopril 10mg ) refilled today , informed patient medication (lisinopril10 mg ) would be sent to pharmacy today for refill.  Patient gave a verbal understanding with out any other concerns

## 2023-04-02 DIAGNOSIS — G4733 Obstructive sleep apnea (adult) (pediatric): Secondary | ICD-10-CM | POA: Diagnosis not present

## 2023-04-11 ENCOUNTER — Telehealth: Payer: Self-pay | Admitting: Family Medicine

## 2023-04-11 DIAGNOSIS — M1A9XX0 Chronic gout, unspecified, without tophus (tophi): Secondary | ICD-10-CM

## 2023-04-11 MED ORDER — ALLOPURINOL 100 MG PO TABS
200.0000 mg | ORAL_TABLET | Freq: Every day | ORAL | 1 refills | Status: DC
Start: 2023-04-11 — End: 2023-10-08

## 2023-04-11 NOTE — Telephone Encounter (Signed)
  allopurinol (ZYLOPRIM) 100 MG tablet 90 day supply   Patient requesting the above medication to be called into [facility] on file as a 90day supply.   Thank you

## 2023-04-11 NOTE — Telephone Encounter (Signed)
Refilled Allopurinol to [facility]

## 2023-04-11 NOTE — Telephone Encounter (Signed)
Patient was informed that medication was refilled as requested and sent to pharmacy in chart

## 2023-04-13 DIAGNOSIS — J3081 Allergic rhinitis due to animal (cat) (dog) hair and dander: Secondary | ICD-10-CM | POA: Diagnosis not present

## 2023-04-13 DIAGNOSIS — J301 Allergic rhinitis due to pollen: Secondary | ICD-10-CM | POA: Diagnosis not present

## 2023-04-13 DIAGNOSIS — J3089 Other allergic rhinitis: Secondary | ICD-10-CM | POA: Diagnosis not present

## 2023-04-19 ENCOUNTER — Other Ambulatory Visit: Payer: Self-pay | Admitting: Family Medicine

## 2023-04-19 DIAGNOSIS — I10 Essential (primary) hypertension: Secondary | ICD-10-CM

## 2023-04-20 DIAGNOSIS — J3089 Other allergic rhinitis: Secondary | ICD-10-CM | POA: Diagnosis not present

## 2023-04-20 DIAGNOSIS — J3081 Allergic rhinitis due to animal (cat) (dog) hair and dander: Secondary | ICD-10-CM | POA: Diagnosis not present

## 2023-04-20 DIAGNOSIS — J301 Allergic rhinitis due to pollen: Secondary | ICD-10-CM | POA: Diagnosis not present

## 2023-04-25 DIAGNOSIS — J3089 Other allergic rhinitis: Secondary | ICD-10-CM | POA: Diagnosis not present

## 2023-04-25 DIAGNOSIS — J3081 Allergic rhinitis due to animal (cat) (dog) hair and dander: Secondary | ICD-10-CM | POA: Diagnosis not present

## 2023-04-25 DIAGNOSIS — J301 Allergic rhinitis due to pollen: Secondary | ICD-10-CM | POA: Diagnosis not present

## 2023-04-27 DIAGNOSIS — J301 Allergic rhinitis due to pollen: Secondary | ICD-10-CM | POA: Diagnosis not present

## 2023-04-27 DIAGNOSIS — J3089 Other allergic rhinitis: Secondary | ICD-10-CM | POA: Diagnosis not present

## 2023-04-27 DIAGNOSIS — J3081 Allergic rhinitis due to animal (cat) (dog) hair and dander: Secondary | ICD-10-CM | POA: Diagnosis not present

## 2023-05-03 DIAGNOSIS — G4733 Obstructive sleep apnea (adult) (pediatric): Secondary | ICD-10-CM | POA: Diagnosis not present

## 2023-05-11 DIAGNOSIS — J301 Allergic rhinitis due to pollen: Secondary | ICD-10-CM | POA: Diagnosis not present

## 2023-05-11 DIAGNOSIS — J3081 Allergic rhinitis due to animal (cat) (dog) hair and dander: Secondary | ICD-10-CM | POA: Diagnosis not present

## 2023-05-11 DIAGNOSIS — J3089 Other allergic rhinitis: Secondary | ICD-10-CM | POA: Diagnosis not present

## 2023-05-18 DIAGNOSIS — J3081 Allergic rhinitis due to animal (cat) (dog) hair and dander: Secondary | ICD-10-CM | POA: Diagnosis not present

## 2023-05-18 DIAGNOSIS — J3089 Other allergic rhinitis: Secondary | ICD-10-CM | POA: Diagnosis not present

## 2023-05-18 DIAGNOSIS — J301 Allergic rhinitis due to pollen: Secondary | ICD-10-CM | POA: Diagnosis not present

## 2023-05-19 ENCOUNTER — Other Ambulatory Visit: Payer: Self-pay | Admitting: Family Medicine

## 2023-05-19 DIAGNOSIS — E785 Hyperlipidemia, unspecified: Secondary | ICD-10-CM

## 2023-05-25 DIAGNOSIS — J3081 Allergic rhinitis due to animal (cat) (dog) hair and dander: Secondary | ICD-10-CM | POA: Diagnosis not present

## 2023-05-25 DIAGNOSIS — J3089 Other allergic rhinitis: Secondary | ICD-10-CM | POA: Diagnosis not present

## 2023-05-25 DIAGNOSIS — J301 Allergic rhinitis due to pollen: Secondary | ICD-10-CM | POA: Diagnosis not present

## 2023-06-01 DIAGNOSIS — J301 Allergic rhinitis due to pollen: Secondary | ICD-10-CM | POA: Diagnosis not present

## 2023-06-01 DIAGNOSIS — J3081 Allergic rhinitis due to animal (cat) (dog) hair and dander: Secondary | ICD-10-CM | POA: Diagnosis not present

## 2023-06-01 DIAGNOSIS — J3089 Other allergic rhinitis: Secondary | ICD-10-CM | POA: Diagnosis not present

## 2023-06-02 DIAGNOSIS — G4733 Obstructive sleep apnea (adult) (pediatric): Secondary | ICD-10-CM | POA: Diagnosis not present

## 2023-06-06 DIAGNOSIS — J301 Allergic rhinitis due to pollen: Secondary | ICD-10-CM | POA: Diagnosis not present

## 2023-06-14 ENCOUNTER — Other Ambulatory Visit: Payer: Self-pay | Admitting: Family Medicine

## 2023-06-14 ENCOUNTER — Ambulatory Visit: Payer: Medicare HMO

## 2023-06-14 ENCOUNTER — Ambulatory Visit (INDEPENDENT_AMBULATORY_CARE_PROVIDER_SITE_OTHER): Payer: Medicare HMO | Admitting: Family Medicine

## 2023-06-14 VITALS — BP 127/66 | HR 99 | Temp 98.1°F | Resp 18 | Ht 69.0 in | Wt 187.7 lb

## 2023-06-14 DIAGNOSIS — M791 Myalgia, unspecified site: Secondary | ICD-10-CM

## 2023-06-14 DIAGNOSIS — M439 Deforming dorsopathy, unspecified: Secondary | ICD-10-CM

## 2023-06-14 DIAGNOSIS — M546 Pain in thoracic spine: Secondary | ICD-10-CM

## 2023-06-14 DIAGNOSIS — M4012 Other secondary kyphosis, cervical region: Secondary | ICD-10-CM

## 2023-06-14 DIAGNOSIS — Z23 Encounter for immunization: Secondary | ICD-10-CM

## 2023-06-14 DIAGNOSIS — M50322 Other cervical disc degeneration at C5-C6 level: Secondary | ICD-10-CM

## 2023-06-14 DIAGNOSIS — M50323 Other cervical disc degeneration at C6-C7 level: Secondary | ICD-10-CM | POA: Diagnosis not present

## 2023-06-14 DIAGNOSIS — M5033 Other cervical disc degeneration, cervicothoracic region: Secondary | ICD-10-CM

## 2023-06-14 DIAGNOSIS — M50321 Other cervical disc degeneration at C4-C5 level: Secondary | ICD-10-CM | POA: Diagnosis not present

## 2023-06-14 DIAGNOSIS — M549 Dorsalgia, unspecified: Secondary | ICD-10-CM | POA: Diagnosis not present

## 2023-06-14 DIAGNOSIS — M5031 Other cervical disc degeneration,  high cervical region: Secondary | ICD-10-CM | POA: Diagnosis not present

## 2023-06-14 DIAGNOSIS — M47814 Spondylosis without myelopathy or radiculopathy, thoracic region: Secondary | ICD-10-CM | POA: Diagnosis not present

## 2023-06-14 DIAGNOSIS — M4802 Spinal stenosis, cervical region: Secondary | ICD-10-CM | POA: Diagnosis not present

## 2023-06-14 DIAGNOSIS — M419 Scoliosis, unspecified: Secondary | ICD-10-CM | POA: Diagnosis not present

## 2023-06-14 MED ORDER — BACLOFEN 5 MG PO TABS
5.0000 mg | ORAL_TABLET | Freq: Three times a day (TID) | ORAL | 0 refills | Status: DC | PRN
Start: 2023-06-14 — End: 2024-06-23

## 2023-06-14 NOTE — Progress Notes (Signed)
Acute Office Visit  Subjective:     Patient ID: Derek Kent, male    DOB: Apr 24, 1956, 67 y.o.   MRN: [id number]  Chief Complaint  Patient presents with   Back Pain    Patient states that for the last 3 months he has been experiencing some upper back tightness and pain while walking he states that he and his wife take a 25 min brisk walk 2 x's daily and he says lately its been hurting him in his upper back, he feels also feels pressure and pain.    Back Pain Patient is in today for acute visit.  Pt reports he walks 25 mins 2x a day when walking. He has had pain and tension when walking for the last 3 months. He says he's had this pain for years but has noticed it more lately. He reports when he walks up the stairs, he has some sensations in his head and back of his neck. He says when he feels the pain, it is relieved when he raises his arms above his head. The pain feels like a cramp.   Review of Systems  Musculoskeletal:  Positive for back pain and neck pain.  All other systems reviewed and are negative.      Objective:    BP 127/66   Pulse 99   Temp 98.1 F (36.7 C) (Oral)   Resp 18   Ht 5\' 9"  (1.753 m)   Wt 187 lb 11.2 oz (85.1 kg)   SpO2 96%   BMI 27.72 kg/m  BP Readings from Last 3 Encounters:  06/14/23 127/66  03/25/23 (!) 152/82  10/09/22 116/71      Physical Exam Vitals and nursing note reviewed.  Constitutional:      Appearance: Normal appearance. He is normal weight.  HENT:     Head: Normocephalic and atraumatic.     Right Ear: External ear normal.     Left Ear: External ear normal.     Nose: Nose normal.     Mouth/Throat:     Mouth: Mucous membranes are moist.     Pharynx: Oropharynx is clear.  Eyes:     Conjunctiva/sclera: Conjunctivae normal.     Pupils: Pupils are equal, round, and reactive to light.  Neck:     Comments: Diminished ROM of cervical spine Mild kyphosis and scoliosis Cardiovascular:     Rate and Rhythm: Normal rate.   Pulmonary:     Effort: Pulmonary effort is normal.  Musculoskeletal:     Cervical back: Rigidity present.  Skin:    General: Skin is warm.     Capillary Refill: Capillary refill takes less than 2 seconds.  Neurological:     General: No focal deficit present.     Mental Status: He is alert and oriented to person, place, and time. Mental status is at baseline.  Psychiatric:        Mood and Affect: Mood normal.        Behavior: Behavior normal.        Thought Content: Thought content normal.        Judgment: Judgment normal.   No results found for any visits on 06/14/23.      Assessment & Plan:   Problem List Items Addressed This Visit   None Curvature of thoracic spine -     Baclofen; Take 1 tablet (5 mg total) by mouth 3 (three) times daily as needed.  Dispense: 30 tablet; Refill: 0  Other secondary kyphosis, cervical  region -     Baclofen; Take 1 tablet (5 mg total) by mouth 3 (three) times daily as needed.  Dispense: 30 tablet; Refill: 0  Muscle tension pain -     Baclofen; Take 1 tablet (5 mg total) by mouth 3 (three) times daily as needed.  Dispense: 30 tablet; Refill: 0  Need for influenza vaccination -     Flu Vaccine Trivalent High Dose (Fluad)   Pt noted to have kyphosis and thoracic spinal curvature. Suspect this is causing some myopathy symptoms and muscle tension. To treat with Baclofen 5mg  TID prn and send for xrays to check C and T spine.  Flu vaccine today   No orders of the defined types were placed in this encounter.   No follow-ups on file.  Suzan Slick, MD

## 2023-06-15 DIAGNOSIS — J3089 Other allergic rhinitis: Secondary | ICD-10-CM | POA: Diagnosis not present

## 2023-06-15 DIAGNOSIS — J3081 Allergic rhinitis due to animal (cat) (dog) hair and dander: Secondary | ICD-10-CM | POA: Diagnosis not present

## 2023-06-15 DIAGNOSIS — J301 Allergic rhinitis due to pollen: Secondary | ICD-10-CM | POA: Diagnosis not present

## 2023-06-19 DIAGNOSIS — I10 Essential (primary) hypertension: Secondary | ICD-10-CM | POA: Diagnosis not present

## 2023-06-22 ENCOUNTER — Ambulatory Visit (INDEPENDENT_AMBULATORY_CARE_PROVIDER_SITE_OTHER): Payer: Medicare HMO | Admitting: Family Medicine

## 2023-06-22 ENCOUNTER — Encounter: Payer: Self-pay | Admitting: Family Medicine

## 2023-06-22 VITALS — BP 111/67 | HR 70 | Temp 97.7°F | Resp 18 | Ht 69.0 in | Wt 186.4 lb

## 2023-06-22 DIAGNOSIS — J3089 Other allergic rhinitis: Secondary | ICD-10-CM | POA: Diagnosis not present

## 2023-06-22 DIAGNOSIS — J3081 Allergic rhinitis due to animal (cat) (dog) hair and dander: Secondary | ICD-10-CM | POA: Diagnosis not present

## 2023-06-22 DIAGNOSIS — Z0000 Encounter for general adult medical examination without abnormal findings: Secondary | ICD-10-CM

## 2023-06-22 DIAGNOSIS — J301 Allergic rhinitis due to pollen: Secondary | ICD-10-CM | POA: Diagnosis not present

## 2023-06-22 NOTE — Patient Instructions (Addendum)
  Mr. Derek Kent , Thank you for taking time to come for your Medicare Wellness Visit. I appreciate your ongoing commitment to your health goals. Please review the following plan we discussed and let me know if I can assist you in the future.   These are the goals we discussed:  Goals   None     This is a list of the screening recommended for you and due dates:  Health Maintenance  Topic Date Due   Hepatitis C Screening  Never done   COVID-19 Vaccine (6 - 2023-24 season) 07/08/2023*   Medicare Annual Wellness Visit  06/21/2024   Colon Cancer Screening  10/19/2026   DTaP/Tdap/Td vaccine (3 - Td or Tdap) 04/24/2028   Pneumonia Vaccine  Completed   Flu Shot  Completed   Zoster (Shingles) Vaccine  Completed   HPV Vaccine  Aged Out  *Topic was postponed. The date shown is not the original due date.

## 2023-06-22 NOTE — Progress Notes (Signed)
Subjective:   Derek Kent is a 67 y.o. male who presents for an Initial Medicare Annual Wellness Visit.  Visit Complete: In person  Patient Medicare AWV questionnaire was completed by the patient on the day of the exam; I have confirmed that all information answered by patient is correct and no changes since this date.  Cardiac Risk Factors include: male gender;advanced age (>15men, >68 women);hypertension     Objective:    Today's Vitals   06/22/23 0823  BP: 111/67  Pulse: 70  Resp: 18  Temp: 97.7 F (36.5 C)  TempSrc: Oral  SpO2: 96%  Weight: 186 lb 6.4 oz (84.6 kg)  Height: 5\' 9"  (1.753 m)   Body mass index is 27.53 kg/m.     06/22/2023    8:30 AM 10/09/2022   10:50 AM 10/16/2015   10:43 PM  Advanced Directives  Does Patient Have a Medical Advance Directive? No No No  Would patient like information on creating a medical advance directive? Yes (Inpatient - patient defers creating a medical advance directive at this time - Information given) No - Patient declined No - patient declined information    Current Medications (verified) Outpatient Encounter Medications as of 06/22/2023  Medication Sig   allopurinol (ZYLOPRIM) 100 MG tablet Take 2 tablets (200 mg total) by mouth daily.   Baclofen 5 MG TABS Take 1 tablet (5 mg total) by mouth 3 (three) times daily as needed.   ciclopirox (LOPROX) 0.77 % cream Apply topically 2 (two) times daily.   EPINEPHrine 0.3 mg/0.3 mL IJ SOAJ injection as directed Injection prn 30 days   fish oil-omega-3 fatty acids 1000 MG capsule Take 2 g by mouth daily.   fluticasone (FLONASE) 50 MCG/ACT nasal spray Place 1 spray into both nostrils daily.   hydrOXYzine (ATARAX) 25 MG tablet Take 1 tablet (25 mg total) by mouth every 8 (eight) hours as needed for itching.   lisinopril (ZESTRIL) 10 MG tablet TAKE 1 TABLET BY MOUTH EVERY DAY   Multiple Vitamins-Minerals (MULTIVITAMIN WITH MINERALS) tablet Take 1 tablet by mouth daily.   pravastatin  (PRAVACHOL) 10 MG tablet TAKE 1 TABLET BY MOUTH EVERY DAY   tamsulosin (FLOMAX) 0.4 MG CAPS capsule 1 capsule by mouth Once a day 90 days   VITAMIN D, CHOLECALCIFEROL, PO Take by mouth.   No facility-administered encounter medications on file as of 06/22/2023.    Allergies (verified) Ibuprofen   History: Past Medical History:  Diagnosis Date   Atypical chest pain    normal echo and nuclear stress test 2017   CKD (chronic kidney disease) stage 3, GFR 30-59 ml/min (HCC)    proteinuria   Family history of premature coronary artery disease 11/02/2015   Fatty liver    Gout    Hiatal hernia    Hypertension    PVC's (premature ventricular contractions) 11/02/2015   Past Surgical History:  Procedure Laterality Date   NO PAST SURGERIES     Family History  Problem Relation Age of Onset   Breast cancer Mother    Heart disease Father    Heart attack Maternal Uncle    Heart attack Paternal Uncle    Heart attack Maternal Grandfather    Social History   Socioeconomic History   Marital status: Married    Spouse name: Not on file   Number of children: Not on file   Years of education: Not on file   Highest education level: Bachelor's degree (e.g., BA, AB, BS)  Occupational History  Occupation: Retired  Tobacco Use   Smoking status: Never   Smokeless tobacco: Never  Vaping Use   Vaping status: Never Used  Substance and Sexual Activity   Alcohol use: No   Drug use: No   Sexual activity: Yes    Birth control/protection: None  Other Topics Concern   Not on file  Social History Narrative   Not on file   Social Determinants of Health   Financial Resource Strain: Low Risk  (06/14/2023)   Overall Financial Resource Strain (CARDIA)    Difficulty of Paying Living Expenses: Not hard at all  Food Insecurity: No Food Insecurity (06/14/2023)   Hunger Vital Sign    Worried About Running Out of Food in the Last Year: Never true    Ran Out of Food in the Last Year: Never true   Transportation Needs: No Transportation Needs (06/14/2023)   PRAPARE - Administrator, Civil Service (Medical): No    Lack of Transportation (Non-Medical): No  Physical Activity: Sufficiently Active (06/14/2023)   Exercise Vital Sign    Days of Exercise per Week: 7 days    Minutes of Exercise per Session: 50 min  Stress: No Stress Concern Present (06/14/2023)   [employer] of Occupational Health - Occupational Stress Questionnaire    Feeling of Stress : Not at all  Social Connections: Unknown (06/14/2023)   Social Connection and Isolation Panel [NHANES]    Frequency of Communication with Friends and Family: Patient declined    Frequency of Social Gatherings with Friends and Family: Patient declined    Attends Religious Services: More than 4 times per year    Active Member of [employer] or Organizations: Yes    Attends Engineer, structural: More than 4 times per year    Marital Status: Married    Tobacco Counseling Counseling given: Not Answered   Clinical Intake:  Pre-visit preparation completed: Yes  Pain : No/denies pain     BMI - recorded: 27.5 Nutritional Status: BMI 25 -29 Overweight Nutritional Risks: None Diabetes: No  How often do you need to have someone help you when you read instructions, pamphlets, or other written materials from your doctor or pharmacy?: 1 - Never What is the last grade level you completed in school?: College Degrees  Interpreter Needed?: No      Activities of Daily Living    06/22/2023    8:29 AM  In your present state of health, do you have any difficulty performing the following activities:  Hearing? 0  Vision? 0  Difficulty concentrating or making decisions? 0  Walking or climbing stairs? 0  Dressing or bathing? 0  Doing errands, shopping? 0  Preparing Food and eating ? N  Using the Toilet? N  In the past six months, have you accidently leaked urine? N  Do you have problems with loss of bowel control? N   Managing your Medications? N  Managing your Finances? N  Housekeeping or managing your Housekeeping? N    Patient Care Team: Suzan Slick, MD as PCP - General (Family Medicine)  Indicate any recent Medical Services you may have received from other than [facility] providers in the past year (date may be approximate).     Assessment:   This is a routine wellness examination for Nickolous.  Hearing/Vision screen No results found.   Goals Addressed   None   Depression Screen    10/09/2022   10:38 AM  PHQ 2/9 Scores  PHQ - 2 Score  0  PHQ- 9 Score 0    Fall Risk    06/22/2023    8:31 AM 10/09/2022   10:37 AM  Fall Risk   Falls in the past year? 0 0  Number falls in past yr: 0 0  Injury with Fall? 0 0  Risk for fall due to : No Fall Risks No Fall Risks  Follow up Falls evaluation completed Falls evaluation completed    MEDICARE RISK AT HOME: Medicare Risk at Home Any stairs in or around the home?: Yes If so, are there any without handrails?: Yes Home free of loose throw rugs in walkways, pet beds, electrical cords, etc?: Yes Adequate lighting in your home to reduce risk of falls?: Yes Life alert?: No Use of a cane, walker or w/c?: No Grab bars in the bathroom?: No Shower chair or bench in shower?: No Elevated toilet seat or a handicapped toilet?: No  TIMED UP AND GO:  Was the test performed? Yes  Length of time to ambulate 10 feet: 8 sec Gait steady and fast with assistive device    Cognitive Function:        06/22/2023    8:32 AM  6CIT Screen  What Year? 0 points  What month? 0 points  What time? 0 points  Count back from 20 0 points  Months in reverse 0 points  Repeat phrase 0 points  Total Score 0 points    Immunizations Immunization History  Administered Date(s) Administered   Fluad Trivalent(High Dose 65+) 06/14/2023   Influenza-Unspecified 06/05/2022   PFIZER(Purple Top)SARS-COV-2 Vaccination 10/23/2019, 03/06/2020, 06/25/2020, 03/11/2021    PNEUMOCOCCAL CONJUGATE-20 10/09/2022   Pfizer Covid-19 Vaccine Bivalent Booster 70yrs & up 08/19/2021   Td 04/24/2018   Tdap 09/29/2008   Zoster Recombinant(Shingrix) 07/07/2018, 10/25/2018    TDAP status: Up to date  Flu Vaccine status: Up to date  Pneumococcal vaccine status: Up to date  Covid-19 vaccine status: Declined, Education has been provided regarding the importance of this vaccine but patient still declined. Advised may receive this vaccine at local pharmacy or Health Dept.or vaccine clinic. Aware to provide a copy of the vaccination record if obtained from local pharmacy or Health Dept. Verbalized acceptance and understanding.  Qualifies for Shingles Vaccine? Yes   Zostavax completed Yes   Shingrix Completed?: Yes  Screening Tests Health Maintenance  Topic Date Due   Hepatitis C Screening  Never done   COVID-19 Vaccine (6 - 2023-24 season) 04/08/2023   Medicare Annual Wellness (AWV)  06/21/2024   Colonoscopy  10/19/2026   DTaP/Tdap/Td (3 - Td or Tdap) 04/24/2028   Pneumonia Vaccine 98+ Years old  Completed   INFLUENZA VACCINE  Completed   Zoster Vaccines- Shingrix  Completed   HPV VACCINES  Aged Out    Health Maintenance  Health Maintenance Due  Topic Date Due   Hepatitis C Screening  Never done   COVID-19 Vaccine (6 - 2023-24 season) 04/08/2023    Colorectal cancer screening: Type of screening: Colonoscopy. Completed 10/18/2021. Repeat every 5 years  Lung Cancer Screening: (Low Dose CT Chest recommended if Age 7-80 years, 20 pack-year currently smoking OR have quit w/in 15years.) does not qualify.   Lung Cancer Screening Referral: N/A  Additional Screening:  Hepatitis C Screening: does not qualify; Completed N/A  Vision Screening: Recommended annual ophthalmology exams for early detection of glaucoma and other disorders of the eye. Is the patient up to date with their annual eye exam?  Yes  Who is the provider or  what is the name of the office in  which the patient attends annual eye exams? Dr Marik[facility] If pt is not established with a provider, would they like to be referred to a provider to establish care? No .   Dental Screening: Recommended annual dental exams for proper oral hygiene  Diabetic Foot Exam: Diabetic Foot Exam: Completed not diabetic N/A  Community Resource Referral / Chronic Care Management: CRR required this visit?  No   CCM required this visit?  No    Plan:     I have personally reviewed and noted the following in the patient's chart:   Medical and social history Use of alcohol, tobacco or illicit drugs  Current medications and supplements including opioid prescriptions. Patient is not currently taking opioid prescriptions. Functional ability and status Nutritional status Physical activity Advanced directives List of other physicians Hospitalizations, surgeries, and ER visits in previous 12 months Vitals Screenings to include cognitive, depression, and falls Referrals and appointments  In addition, I have reviewed and discussed with patient certain preventive protocols, quality metrics, and best practice recommendations. A written personalized care plan for preventive services as well as general preventive health recommendations were provided to patient.     Suzan Slick, MD   06/22/2023   After Visit Summary: (In Person-Printed) AVS printed and given to the patient

## 2023-06-25 ENCOUNTER — Encounter: Payer: Self-pay | Admitting: Family Medicine

## 2023-06-25 ENCOUNTER — Ambulatory Visit (INDEPENDENT_AMBULATORY_CARE_PROVIDER_SITE_OTHER): Payer: Medicare HMO | Admitting: Family Medicine

## 2023-06-25 VITALS — BP 132/69 | HR 62 | Temp 97.8°F | Resp 18 | Ht 69.0 in | Wt 187.0 lb

## 2023-06-25 DIAGNOSIS — Z136 Encounter for screening for cardiovascular disorders: Secondary | ICD-10-CM | POA: Diagnosis not present

## 2023-06-25 DIAGNOSIS — R7302 Impaired glucose tolerance (oral): Secondary | ICD-10-CM

## 2023-06-25 DIAGNOSIS — M4802 Spinal stenosis, cervical region: Secondary | ICD-10-CM

## 2023-06-25 DIAGNOSIS — Z0000 Encounter for general adult medical examination without abnormal findings: Secondary | ICD-10-CM | POA: Diagnosis not present

## 2023-06-25 DIAGNOSIS — Z125 Encounter for screening for malignant neoplasm of prostate: Secondary | ICD-10-CM

## 2023-06-25 DIAGNOSIS — Z13220 Encounter for screening for lipoid disorders: Secondary | ICD-10-CM | POA: Diagnosis not present

## 2023-06-25 DIAGNOSIS — M4012 Other secondary kyphosis, cervical region: Secondary | ICD-10-CM

## 2023-06-25 NOTE — Progress Notes (Signed)
Complete physical exam  Patient: Derek Kent   DOB: 10/03/1955   67 y.o. Male  MRN: [id number]  Subjective:    Chief Complaint  Patient presents with   Annual Exam    Derek Kent is a 67 y.o. male who presents today for a complete physical exam. He reports consuming a general diet.  He generally feels well. He reports sleeping fairly well. He does not have additional problems to discuss today.    Most recent fall risk assessment:    06/22/2023    8:31 AM  Fall Risk   Falls in the past year? 0  Number falls in past yr: 0  Injury with Fall? 0  Risk for fall due to : No Fall Risks  Follow up Falls evaluation completed     Most recent depression screenings:    10/09/2022   10:38 AM  PHQ 2/9 Scores  PHQ - 2 Score 0  PHQ- 9 Score 0    Vision:Within last year and Dental: No current dental problems  Patient Active Problem List   Diagnosis Date Noted   Allergic rhinitis due to pollen 10/09/2022   CKD (chronic kidney disease) stage 3, GFR 30-59 ml/min (HCC) 10/09/2022   Mitral regurgitation 11/01/2016   Family history of premature coronary artery disease 11/02/2015   Benign essential HTN 11/02/2015   PVC's (premature ventricular contractions) 11/02/2015   Past Medical History:  Diagnosis Date   Atypical chest pain    normal echo and nuclear stress test 2017   CKD (chronic kidney disease) stage 3, GFR 30-59 ml/min (HCC)    proteinuria   Family history of premature coronary artery disease 11/02/2015   Fatty liver    Gout    Hiatal hernia    Hypertension    PVC's (premature ventricular contractions) 11/02/2015   Past Surgical History:  Procedure Laterality Date   NO PAST SURGERIES     Social History   Socioeconomic History   Marital status: Married    Spouse name: Not on file   Number of children: Not on file   Years of education: Not on file   Highest education level: Bachelor's degree (e.g., BA, AB, BS)  Occupational History   Occupation: Retired   Tobacco Use   Smoking status: Never   Smokeless tobacco: Never  Vaping Use   Vaping status: Never Used  Substance and Sexual Activity   Alcohol use: No   Drug use: No   Sexual activity: Yes    Birth control/protection: None  Other Topics Concern   Not on file  Social History Narrative   Not on file   Social Determinants of Health   Financial Resource Strain: Low Risk  (06/14/2023)   Overall Financial Resource Strain (CARDIA)    Difficulty of Paying Living Expenses: Not hard at all  Food Insecurity: No Food Insecurity (06/14/2023)   Hunger Vital Sign    Worried About Running Out of Food in the Last Year: Never true    Ran Out of Food in the Last Year: Never true  Transportation Needs: No Transportation Needs (06/14/2023)   PRAPARE - Administrator, Civil Service (Medical): No    Lack of Transportation (Non-Medical): No  Physical Activity: Sufficiently Active (06/14/2023)   Exercise Vital Sign    Days of Exercise per Week: 7 days    Minutes of Exercise per Session: 50 min  Stress: No Stress Concern Present (06/14/2023)   [employer] of Occupational Health - Occupational Stress  Questionnaire    Feeling of Stress : Not at all  Social Connections: Unknown (06/14/2023)   Social Connection and Isolation Panel [NHANES]    Frequency of Communication with Friends and Family: Patient declined    Frequency of Social Gatherings with Friends and Family: Patient declined    Attends Religious Services: More than 4 times per year    Active Member of [employer] or Organizations: Yes    Attends Engineer, structural: More than 4 times per year    Marital Status: Married  Catering manager Violence: Not on file   Family Status  Relation Name Status   Mother  Deceased   Father  Deceased   Mat Uncle  Deceased   Pat Uncle  Deceased   MGM  Deceased   MGF  Deceased   PGM  Deceased   PGF  Deceased  No partnership data on file   Family History  Problem Relation Age of  Onset   Breast cancer Mother    Heart disease Father    Heart attack Maternal Uncle    Heart attack Paternal Uncle    Heart attack Maternal Grandfather    Allergies  Allergen Reactions   Ibuprofen Other (See Comments)    Hyper activity      Patient Care Team: Suzan Slick, MD as PCP - General (Family Medicine)   Outpatient Medications Prior to Visit  Medication Sig   allopurinol (ZYLOPRIM) 100 MG tablet Take 2 tablets (200 mg total) by mouth daily.   Baclofen 5 MG TABS Take 1 tablet (5 mg total) by mouth 3 (three) times daily as needed.   ciclopirox (LOPROX) 0.77 % cream Apply topically 2 (two) times daily.   EPINEPHrine 0.3 mg/0.3 mL IJ SOAJ injection as directed Injection prn 30 days   fish oil-omega-3 fatty acids 1000 MG capsule Take 2 g by mouth daily.   fluticasone (FLONASE) 50 MCG/ACT nasal spray Place 1 spray into both nostrils daily.   hydrOXYzine (ATARAX) 25 MG tablet Take 1 tablet (25 mg total) by mouth every 8 (eight) hours as needed for itching.   lisinopril (ZESTRIL) 10 MG tablet TAKE 1 TABLET BY MOUTH EVERY DAY   Multiple Vitamins-Minerals (MULTIVITAMIN WITH MINERALS) tablet Take 1 tablet by mouth daily.   pravastatin (PRAVACHOL) 10 MG tablet TAKE 1 TABLET BY MOUTH EVERY DAY   tamsulosin (FLOMAX) 0.4 MG CAPS capsule 1 capsule by mouth Once a day 90 days   VITAMIN D, CHOLECALCIFEROL, PO Take by mouth.   No facility-administered medications prior to visit.    Review of Systems  All other systems reviewed and are negative.        Objective:     BP 132/69   Pulse 62   Temp 97.8 F (36.6 C) (Oral)   Resp 18   Ht 5\' 9"  (1.753 m)   Wt 187 lb (84.8 kg)   SpO2 97%   BMI 27.62 kg/m  BP Readings from Last 3 Encounters:  06/25/23 132/69  06/22/23 111/67  06/14/23 127/66      Physical Exam Vitals and nursing note reviewed.  Constitutional:      Appearance: Normal appearance. He is normal weight.  HENT:     Head: Normocephalic and atraumatic.      Right Ear: Tympanic membrane, ear canal and external ear normal.     Left Ear: Tympanic membrane, ear canal and external ear normal.     Nose: Nose normal.     Mouth/Throat:  Mouth: Mucous membranes are moist.     Pharynx: Oropharynx is clear.  Eyes:     Conjunctiva/sclera: Conjunctivae normal.     Pupils: Pupils are equal, round, and reactive to light.  Cardiovascular:     Rate and Rhythm: Normal rate and regular rhythm.     Pulses: Normal pulses.     Heart sounds: Normal heart sounds.  Pulmonary:     Effort: Pulmonary effort is normal.     Breath sounds: Normal breath sounds.  Abdominal:     General: Abdomen is flat. Bowel sounds are normal.  Skin:    General: Skin is warm.     Capillary Refill: Capillary refill takes less than 2 seconds.  Neurological:     General: No focal deficit present.     Mental Status: He is alert and oriented to person, place, and time. Mental status is at baseline.  Psychiatric:        Mood and Affect: Mood normal.        Behavior: Behavior normal.        Thought Content: Thought content normal.        Judgment: Judgment normal.     No results found for any visits on 06/25/23. Last CBC Lab Results  Component Value Date   WBC 8.0 10/16/2015   HGB 14.6 10/16/2015   HCT 44.2 10/16/2015   MCV 85.8 10/16/2015   MCH 28.3 10/16/2015   RDW 13.4 10/16/2015   PLT 243 10/16/2015   Last metabolic panel Lab Results  Component Value Date   GLUCOSE 161 (H) 10/16/2015   NA 134 (L) 10/16/2015   K 3.7 10/16/2015   CL 100 (L) 10/16/2015   CO2 25 10/16/2015   BUN 23 (H) 10/16/2015   CREATININE 1.24 10/16/2015   GFRNONAA >60 10/16/2015   CALCIUM 8.7 (L) 10/16/2015   ANIONGAP 9 10/16/2015   Last lipids No results found for: "CHOL", "HDL", "LDLCALC", "LDLDIRECT", "TRIG", "CHOLHDL" Last hemoglobin A1c No results found for: "HGBA1C"      Assessment & Plan:    Routine Health Maintenance and Physical Exam  Immunization History  Administered  Date(s) Administered   Fluad Trivalent(High Dose 65+) 06/14/2023   Influenza-Unspecified 06/05/2022   PFIZER(Purple Top)SARS-COV-2 Vaccination 10/23/2019, 03/06/2020, 06/25/2020, 03/11/2021   PNEUMOCOCCAL CONJUGATE-20 10/09/2022   Pfizer Covid-19 Vaccine Bivalent Booster 8yrs & up 08/19/2021   Td 04/24/2018   Tdap 09/29/2008   Zoster Recombinant(Shingrix) 07/07/2018, 10/25/2018    Health Maintenance  Topic Date Due   Hepatitis C Screening  Never done   COVID-19 Vaccine (6 - 2023-24 season) 07/08/2023 (Originally 04/08/2023)   Medicare Annual Wellness (AWV)  06/21/2024   Colonoscopy  10/19/2026   DTaP/Tdap/Td (3 - Td or Tdap) 04/24/2028   Pneumonia Vaccine 61+ Years old  Completed   INFLUENZA VACCINE  Completed   Zoster Vaccines- Shingrix  Completed   HPV VACCINES  Aged Out    Discussed health benefits of physical activity, and encouraged him to engage in regular exercise appropriate for his age and condition.  Problem List Items Addressed This Visit   None  No follow-ups on file. Annual physical exam  Encounter for lipid screening for cardiovascular disease -     Lipid panel  Impaired glucose tolerance -     CBC with Differential/Platelet -     Comprehensive metabolic panel -     Hemoglobin A1c  Screening PSA (prostate specific antigen) -     PSA   Screening labs  See in 6 months  sooner prn    Suzan Slick, MD

## 2023-06-26 LAB — LIPID PANEL
Chol/HDL Ratio: 3.7 ratio (ref 0.0–5.0)
Cholesterol, Total: 170 mg/dL (ref 100–199)
HDL: 46 mg/dL (ref >39)
LDL Chol Calc (NIH): 92 mg/dL (ref 0–99)
Triglycerides: 188 mg/dL — ABNORMAL HIGH (ref 0–149)
VLDL Cholesterol Cal: 32 mg/dL (ref 5–40)

## 2023-06-26 LAB — COMPREHENSIVE METABOLIC PANEL
ALT: 28 [IU]/L (ref 0–44)
AST: 21 [IU]/L (ref 0–40)
Albumin: 4.6 g/dL (ref 3.9–4.9)
Alkaline Phosphatase: 74 [IU]/L (ref 44–121)
BUN/Creatinine Ratio: 16 (ref 10–24)
BUN: 21 mg/dL (ref 8–27)
Bilirubin Total: 0.7 mg/dL (ref 0.0–1.2)
CO2: 17 mmol/L — ABNORMAL LOW (ref 20–29)
Calcium: 9.3 mg/dL (ref 8.6–10.2)
Chloride: 102 mmol/L (ref 96–106)
Creatinine, Ser: 1.33 mg/dL — ABNORMAL HIGH (ref 0.76–1.27)
Globulin, Total: 2.3 g/dL (ref 1.5–4.5)
Glucose: 91 mg/dL (ref 70–99)
Potassium: 4.7 mmol/L (ref 3.5–5.2)
Sodium: 138 mmol/L (ref 134–144)
Total Protein: 6.9 g/dL (ref 6.0–8.5)
eGFR: 59 mL/min/{1.73_m2} — ABNORMAL LOW (ref >59)

## 2023-06-26 LAB — CBC WITH DIFFERENTIAL/PLATELET
Basophils Absolute: 0.1 10*3/uL (ref 0.0–0.2)
Basos: 1 %
EOS (ABSOLUTE): 0.2 10*3/uL (ref 0.0–0.4)
Eos: 4 %
Hematocrit: 49.3 % (ref 37.5–51.0)
Hemoglobin: 15.9 g/dL (ref 13.0–17.7)
Immature Grans (Abs): 0 10*3/uL (ref 0.0–0.1)
Immature Granulocytes: 0 %
Lymphocytes Absolute: 2 10*3/uL (ref 0.7–3.1)
Lymphs: 32 %
MCH: 29.2 pg (ref 26.6–33.0)
MCHC: 32.3 g/dL (ref 31.5–35.7)
MCV: 91 fL (ref 79–97)
Monocytes Absolute: 0.5 10*3/uL (ref 0.1–0.9)
Monocytes: 8 %
Neutrophils Absolute: 3.4 10*3/uL (ref 1.4–7.0)
Neutrophils: 55 %
Platelets: 203 10*3/uL (ref 150–450)
RBC: 5.45 x10E6/uL (ref 4.14–5.80)
RDW: 13.2 % (ref 11.6–15.4)
WBC: 6.2 10*3/uL (ref 3.4–10.8)

## 2023-06-26 LAB — HEMOGLOBIN A1C
Est. average glucose Bld gHb Est-mCnc: 114 mg/dL
Hgb A1c MFr Bld: 5.6 % (ref 4.8–5.6)

## 2023-06-26 LAB — PSA: Prostate Specific Ag, Serum: 3.9 ng/mL (ref 0.0–4.0)

## 2023-06-29 DIAGNOSIS — J3081 Allergic rhinitis due to animal (cat) (dog) hair and dander: Secondary | ICD-10-CM | POA: Diagnosis not present

## 2023-06-29 DIAGNOSIS — J3089 Other allergic rhinitis: Secondary | ICD-10-CM | POA: Diagnosis not present

## 2023-06-29 DIAGNOSIS — J301 Allergic rhinitis due to pollen: Secondary | ICD-10-CM | POA: Diagnosis not present

## 2023-07-02 DIAGNOSIS — M5030 Other cervical disc degeneration, unspecified cervical region: Secondary | ICD-10-CM | POA: Diagnosis not present

## 2023-07-02 DIAGNOSIS — Z6827 Body mass index (BMI) 27.0-27.9, adult: Secondary | ICD-10-CM | POA: Diagnosis not present

## 2023-07-03 ENCOUNTER — Ambulatory Visit (INDEPENDENT_AMBULATORY_CARE_PROVIDER_SITE_OTHER): Payer: Medicare HMO | Admitting: Family Medicine

## 2023-07-03 ENCOUNTER — Encounter: Payer: Self-pay | Admitting: Family Medicine

## 2023-07-03 ENCOUNTER — Encounter (INDEPENDENT_AMBULATORY_CARE_PROVIDER_SITE_OTHER): Payer: Self-pay

## 2023-07-03 VITALS — BP 123/65 | HR 99 | Temp 98.0°F | Resp 18 | Ht 69.0 in | Wt 189.3 lb

## 2023-07-03 DIAGNOSIS — R972 Elevated prostate specific antigen [PSA]: Secondary | ICD-10-CM

## 2023-07-03 NOTE — Patient Instructions (Signed)
[facility] [address] [place],  [place]  [address]  Get Driving Directions Main: [phone]  Fax: [phone]

## 2023-07-03 NOTE — Progress Notes (Signed)
   Acute Office Visit  Subjective:     Patient ID: Derek Kent, male    DOB: 05/19/1956, 67 y.o.   MRN: [id number]  Chief Complaint  Patient presents with   Medical Management of Chronic Issues    Follow up for lab results    HPI Patient is in today for acute visit.  Pt has hx of Peyronie's disease where he consulted with a urologist due to pain. It resolved on it's own. He also has been getting PSA's since 2005 at the age of 39. He has plotted the values and made note of an increase 1.33 in 2017 to 2.13 in 2019, 2.85in 2022 and now up to 3.9. He denies hematuria but has had some urinary urgency which isn't frequent but does have hesitancy. Pt would like to see urology.    Review of Systems  Genitourinary:  Negative for frequency, hematuria and urgency.       Hesitancy  All other systems reviewed and are negative.      Objective:    BP 123/65   Pulse 99   Temp 98 F (36.7 C) (Oral)   Resp 18   Ht 5\' 9"  (1.753 m)   Wt 189 lb 4.8 oz (85.9 kg)   SpO2 97%   BMI 27.95 kg/m  BP Readings from Last 3 Encounters:  07/03/23 123/65  06/25/23 132/69  06/22/23 111/67      Physical Exam Vitals and nursing note reviewed.  Constitutional:      Appearance: Normal appearance. He is normal weight.  HENT:     Head: Normocephalic and atraumatic.     Right Ear: External ear normal.     Left Ear: External ear normal.     Nose: Nose normal.     Mouth/Throat:     Mouth: Mucous membranes are moist.     Pharynx: Oropharynx is clear.  Eyes:     Conjunctiva/sclera: Conjunctivae normal.     Pupils: Pupils are equal, round, and reactive to light.  Cardiovascular:     Rate and Rhythm: Normal rate.  Pulmonary:     Effort: Pulmonary effort is normal.  Skin:    Capillary Refill: Capillary refill takes less than 2 seconds.  Neurological:     General: No focal deficit present.     Mental Status: He is alert and oriented to person, place, and time. Mental status is at baseline.   Psychiatric:        Mood and Affect: Mood normal.        Behavior: Behavior normal.        Thought Content: Thought content normal.        Judgment: Judgment normal.   No results found for any visits on 07/03/23.      Assessment & Plan:   Problem List Items Addressed This Visit   None Elevated PSA -     Ambulatory referral to Urology   Pt would like to see urology for further evaluation due to rise in prostate levels.   No orders of the defined types were placed in this encounter.   No follow-ups on file.  Suzan Slick, MD

## 2023-07-11 ENCOUNTER — Ambulatory Visit: Payer: Medicare HMO | Attending: Surgery | Admitting: Physical Therapy

## 2023-07-11 ENCOUNTER — Other Ambulatory Visit: Payer: Self-pay

## 2023-07-11 ENCOUNTER — Encounter: Payer: Self-pay | Admitting: Physical Therapy

## 2023-07-11 DIAGNOSIS — M542 Cervicalgia: Secondary | ICD-10-CM

## 2023-07-11 DIAGNOSIS — R293 Abnormal posture: Secondary | ICD-10-CM | POA: Insufficient documentation

## 2023-07-11 DIAGNOSIS — M5030 Other cervical disc degeneration, unspecified cervical region: Secondary | ICD-10-CM | POA: Insufficient documentation

## 2023-07-11 DIAGNOSIS — M4802 Spinal stenosis, cervical region: Secondary | ICD-10-CM | POA: Insufficient documentation

## 2023-07-11 DIAGNOSIS — M4012 Other secondary kyphosis, cervical region: Secondary | ICD-10-CM | POA: Insufficient documentation

## 2023-07-11 NOTE — Therapy (Signed)
OUTPATIENT PHYSICAL THERAPY CERVICAL EVALUATION   Patient Name: Derek Kent MRN: [id number] DOB:1956/05/01, 68 y.o., male Today's Date: 07/11/2023  END OF SESSION:  PT End of Session - 07/11/23 1159     Visit Number 1    Number of Visits 16    Date for PT Re-Evaluation 09/05/23    Authorization Type [employer]    Authorization - Visit Number 1    Progress Note Due on Visit 10    PT Start Time 1100    PT Stop Time 1135    PT Time Calculation (min) 35 min    Activity Tolerance Patient tolerated treatment well    Behavior During Therapy [facility] for tasks assessed/performed             Past Medical History:  Diagnosis Date   Atypical chest pain    normal echo and nuclear stress test 2017   CKD (chronic kidney disease) stage 3, GFR 30-59 ml/min (HCC)    proteinuria   Family history of premature coronary artery disease 11/02/2015   Fatty liver    Gout    Hiatal hernia    Hypertension    PVC's (premature ventricular contractions) 11/02/2015   Past Surgical History:  Procedure Laterality Date   NO PAST SURGERIES     Patient Active Problem List   Diagnosis Date Noted   Allergic rhinitis due to pollen 10/09/2022   CKD (chronic kidney disease) stage 3, GFR 30-59 ml/min (HCC) 10/09/2022   Mitral regurgitation 11/01/2016   Family history of premature coronary artery disease 11/02/2015   Benign essential HTN 11/02/2015   PVC's (premature ventricular contractions) 11/02/2015    PCP: Sundra Aland  REFERRING PROVIDER: Patrici Ranks  REFERRING DIAG: cervical disc degeneration  THERAPY DIAG:  Neck pain  Abnormal posture  Rationale for Evaluation and Treatment: Rehabilitation  ONSET DATE: 04/2023  SUBJECTIVE:                                                                                                                                                                                                         SUBJECTIVE STATEMENT: Pt states that he usually  does 2 brisk 1 mile walks a day. In the past 3-4 months he has started having neck pain during walks. Pain doesn't happen every time and it always ends when he is done walking. He saw MD who did x rays and sent him to the neurosurgeon. Neurosurgeon recommended PT.    PERTINENT HISTORY:  None reported  PAIN:  Are you having pain? Yes: NPRS scale: 0/10 currently, 7/10  at worst/10 Pain location: center of neck/upper back Pain description: grabbing Aggravating factors: walking Relieving factors: rest  PRECAUTIONS: None  RED FLAGS: None     WEIGHT BEARING RESTRICTIONS: No  FALLS:  Has patient fallen in last 6 months? 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  DIAGNOSTIC FINDINGS:  Cervical x ray: Moderate multilevel degenerative disc disease. No acute abnormality seen.  PATIENT SURVEYS:  FOTO 65  COGNITION: Overall cognitive status: Within functional limits for tasks assessed  SENSATION: WFL  POSTURE: rounded shoulders and forward head Neck in Rt cervical sidebending at rest  PALPATION: Hypomobile C3-C7 with CPAs and lateral glides Slight mm spasticity upper traps bilat   CERVICAL ROM:   Active ROM A/PROM (deg) eval  Flexion 50  Extension 25  Right lateral flexion 25  Left lateral flexion 18  Right rotation 33  Left rotation 38   (Blank rows = not tested)  UPPER EXTREMITY MMT:  MMT Right eval Left eval  Shoulder flexion 4+ 4+  Shoulder extension    Shoulder abduction 4+ 4+  Shoulder adduction    Shoulder extension    Shoulder internal rotation    Shoulder external rotation    Middle trapezius    Lower trapezius    Elbow flexion    Elbow extension    Wrist flexion    Wrist extension    Wrist ulnar deviation    Wrist radial deviation    Wrist pronation     Wrist supination    Grip strength     (Blank rows = not tested)    TODAY'S TREATMENT:                                                                                                                              DATE: 07/11/23 See HEP   PATIENT EDUCATION:  Education details: PT POC and goals, HEP Person educated: Patient Education method: Explanation, Demonstration, and Handouts Education comprehension: verbalized understanding and returned demonstration  HOME EXERCISE PROGRAM: Access Code: [email or web address] URL: [email or web address] Date: 07/11/2023 Prepared by: Reggy Eye  Exercises - Low Trap Setting at [facility]  - 1 x daily - 4 x weekly - 3 sets - 10 reps - Shoulder W - External Rotation with Resistance  - 1 x daily - 4 x weekly - 3 sets - 10 reps - Shoulder External Rotation and Scapular Retraction with Resistance  - 1 x daily - 4 x weekly - 3 sets - 10 reps - Seated Cervical Retraction  - 1 x daily - 7 x weekly - 1 sets - 10 reps - 3 seconds hold  ASSESSMENT:  CLINICAL IMPRESSION: Patient is a 67 y.o. male who was seen today for physical therapy evaluation and treatment for cervical disc degeneration. Pt presents with decreased cervical  ROM, decreased postural strength, decreased activity tolerance and increased pain. He will benefit from skilled PT to address deficits and improve tolerance to leisure activities.   OBJECTIVE IMPAIRMENTS: decreased activity tolerance, decreased ROM, decreased strength, and pain.   ACTIVITY LIMITATIONS: locomotion level  PARTICIPATION LIMITATIONS: community activity  PERSONAL FACTORS: Age and Time since onset of injury/illness/exacerbation are also affecting patient's functional outcome.   REHAB POTENTIAL: Good  CLINICAL DECISION MAKING: Stable/uncomplicated  EVALUATION COMPLEXITY: Low   GOALS: Goals reviewed with patient? Yes  SHORT TERM GOALS: Target date: 08/08/2023   Pt will be independent in initial HEP Baseline:   Goal status: INITIAL  2.  Pt will improve cervical rotation by 10 degrees each direction Baseline:  Goal status: INITIAL   LONG TERM GOALS: Target date: 09/05/2023    Pt will be independent in advanced HEP Baseline:  Goal status: INITIAL  2.  Pt will improve FOTO to >= 72 to demo improved functional mobility Baseline:  Goal status: INITIAL  3.  Pt will tolerate walking 1 mile with pain <= 2/10 Baseline:  Goal status: INITIAL    PLAN:  PT FREQUENCY: 1-2x/week  PT DURATION: 8 weeks  PLANNED INTERVENTIONS: 97164- PT Re-evaluation, 97110-Therapeutic exercises, 97530- Therapeutic activity, 97112- Neuromuscular re-education, 97535- Self Care, 60454- Manual therapy, [id number]- Aquatic Therapy, 97014- Electrical stimulation (unattended), [id number]- Traction (mechanical), [id number]- Ionotophoresis 4mg /ml Dexamethasone, Patient/Family education, Taping, Dry Needling, Cryotherapy, and Moist heat  PLAN FOR NEXT SESSION: assess response to HEP, postural strength, cervical mobility   Lesslie Mossa, PT 07/11/2023, 12:00 PM

## 2023-07-13 DIAGNOSIS — J3081 Allergic rhinitis due to animal (cat) (dog) hair and dander: Secondary | ICD-10-CM | POA: Diagnosis not present

## 2023-07-13 DIAGNOSIS — J3089 Other allergic rhinitis: Secondary | ICD-10-CM | POA: Diagnosis not present

## 2023-07-13 DIAGNOSIS — J301 Allergic rhinitis due to pollen: Secondary | ICD-10-CM | POA: Diagnosis not present

## 2023-07-17 ENCOUNTER — Ambulatory Visit: Payer: Medicare HMO | Admitting: Physical Therapy

## 2023-07-17 ENCOUNTER — Encounter: Payer: Self-pay | Admitting: Physical Therapy

## 2023-07-17 DIAGNOSIS — M5030 Other cervical disc degeneration, unspecified cervical region: Secondary | ICD-10-CM | POA: Diagnosis not present

## 2023-07-17 DIAGNOSIS — R293 Abnormal posture: Secondary | ICD-10-CM | POA: Diagnosis not present

## 2023-07-17 DIAGNOSIS — M4012 Other secondary kyphosis, cervical region: Secondary | ICD-10-CM | POA: Diagnosis not present

## 2023-07-17 DIAGNOSIS — M542 Cervicalgia: Secondary | ICD-10-CM

## 2023-07-17 DIAGNOSIS — M4802 Spinal stenosis, cervical region: Secondary | ICD-10-CM | POA: Diagnosis not present

## 2023-07-17 NOTE — Therapy (Signed)
OUTPATIENT PHYSICAL THERAPY CERVICAL TREATMENT   Patient Name: Derek Kent MRN: [id number] DOB:08/16/1955, 67 y.o., male Today's Date: 07/17/2023  END OF SESSION:  PT End of Session - 07/17/23 1521     Visit Number 2    Number of Visits 16    Date for PT Re-Evaluation 09/05/23    Authorization Type [employer] Medicare    Authorization - Visit Number 2    Progress Note Due on Visit 10    PT Start Time 1445    PT Stop Time 1523    PT Time Calculation (min) 38 min    Activity Tolerance Patient tolerated treatment well    Behavior During Therapy [facility] for tasks assessed/performed              Past Medical History:  Diagnosis Date   Atypical chest pain    normal echo and nuclear stress test 2017   CKD (chronic kidney disease) stage 3, GFR 30-59 ml/min (HCC)    proteinuria   Family history of premature coronary artery disease 11/02/2015   Fatty liver    Gout    Hiatal hernia    Hypertension    PVC's (premature ventricular contractions) 11/02/2015   Past Surgical History:  Procedure Laterality Date   NO PAST SURGERIES     Patient Active Problem List   Diagnosis Date Noted   Allergic rhinitis due to pollen 10/09/2022   CKD (chronic kidney disease) stage 3, GFR 30-59 ml/min (HCC) 10/09/2022   Mitral regurgitation 11/01/2016   Family history of premature coronary artery disease 11/02/2015   Benign essential HTN 11/02/2015   PVC's (premature ventricular contractions) 11/02/2015    PCP: Sundra Aland  REFERRING PROVIDER: Patrici Ranks  REFERRING DIAG: cervical disc degeneration  THERAPY DIAG:  Neck pain  Abnormal posture  Rationale for Evaluation and Treatment: Rehabilitation  ONSET DATE: 04/2023  SUBJECTIVE:                                                                                                                                                                                                         SUBJECTIVE STATEMENT: Pt states he has been  compliant with HEP. He has continued to walk. Neck pain is unchanged   PERTINENT HISTORY:  Pt states that he usually does 2 brisk 1 mile walks a day. In the past 3-4 months he has started having neck pain during walks. Pain doesn't happen every time and it always ends when he is done walking. He saw MD who did x rays and sent him to the neurosurgeon. Neurosurgeon  recommended PT.   PAIN:  Are you having pain? Yes: NPRS scale: 0/10 currently, 7/10 at worst/10 Pain location: center of neck/upper back Pain description: grabbing Aggravating factors: walking Relieving factors: rest  PRECAUTIONS: None  RED FLAGS: None     WEIGHT BEARING RESTRICTIONS: No  FALLS:  Has patient fallen in last 6 months? 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  DIAGNOSTIC FINDINGS:  Cervical x ray: Moderate multilevel degenerative disc disease. No acute abnormality seen.  PATIENT SURVEYS:  FOTO 65  COGNITION: Overall cognitive status: Within functional limits for tasks assessed  SENSATION: WFL  POSTURE: rounded shoulders and forward head Neck in Rt cervical sidebending at rest  PALPATION: Hypomobile C3-C7 with CPAs and lateral glides Slight mm spasticity upper traps bilat   CERVICAL ROM:   Active ROM A/PROM (deg) eval  Flexion 50  Extension 25  Right lateral flexion 25  Left lateral flexion 18  Right rotation 33  Left rotation 38   (Blank rows = not tested)  UPPER EXTREMITY MMT:  MMT Right eval Left eval  Shoulder flexion 4+ 4+  Shoulder extension    Shoulder abduction 4+ 4+  Shoulder adduction    Shoulder extension    Shoulder internal rotation    Shoulder external rotation    Middle trapezius    Lower trapezius    Elbow flexion    Elbow extension    Wrist flexion    Wrist  extension    Wrist ulnar deviation    Wrist radial deviation    Wrist pronation    Wrist supination    Grip strength     (Blank rows = not tested)    TODAY'S TREATMENT:                                                                                                                              [facility] Adult PT Treatment:                                                DATE: 07/17/23 Therapeutic Exercise: UBE L2 x 4 min alt fwd/bkwd Bilat ER green TB x 15 W x 15 green TB Serratus wall slide x 12 Serratus wall clock red TB x 10 Elevated plank 2 x 30 sec Prone I, T, Y 2 x 10 Row 15# 15 x 3 sec hold SNAGS rotation x 10 bilat Cervical retraction x 10 Upper trap stretch 3 x 10 sec bilat    DATE: 07/11/23 See HEP   PATIENT EDUCATION:  Education details: PT POC and goals, HEP Person educated: Patient Education method: Explanation, Demonstration, and Handouts Education comprehension: verbalized understanding  and returned demonstration  HOME EXERCISE PROGRAM: Access Code: [email or web address] URL: [email or web address] Date: 07/17/2023 Prepared by: Reggy Eye  Exercises - Low Trap Setting at [facility]  - 1 x daily - 4 x weekly - 3 sets - 10 reps - Shoulder W - External Rotation with Resistance  - 1 x daily - 4 x weekly - 3 sets - 10 reps - Shoulder External Rotation and Scapular Retraction with Resistance  - 1 x daily - 4 x weekly - 3 sets - 10 reps - Seated Cervical Retraction  - 1 x daily - 7 x weekly - 1 sets - 10 reps - 3 seconds hold - Shoulder Flexion Wall Slide with Resistance Band  - 1 x daily - 7 x weekly - 3 sets - 10 reps - Plank with Hands on Table  - 1 x daily - 7 x weekly - 1 sets - 3 reps - 30 seconds hold  ASSESSMENT:  CLINICAL IMPRESSION: Continued to progress postural strength and endurance. Pt with good tolerance to all exercise. He does have UE fatigue with planks, added these to HEP to improve endurance  OBJECTIVE IMPAIRMENTS: decreased activity  tolerance, decreased ROM, decreased strength, and pain.    GOALS: Goals reviewed with patient? Yes  SHORT TERM GOALS: Target date: 08/08/2023   Pt will be independent in initial HEP Baseline:  Goal status: INITIAL  2.  Pt will improve cervical rotation by 10 degrees each direction Baseline:  Goal status: INITIAL   LONG TERM GOALS: Target date: 09/05/2023    Pt will be independent in advanced HEP Baseline:  Goal status: INITIAL  2.  Pt will improve FOTO to >= 72 to demo improved functional mobility Baseline:  Goal status: INITIAL  3.  Pt will tolerate walking 1 mile with pain <= 2/10 Baseline:  Goal status: INITIAL    PLAN:  PT FREQUENCY: 1-2x/week  PT DURATION: 8 weeks  PLANNED INTERVENTIONS: 97164- PT Re-evaluation, 97110-Therapeutic exercises, 97530- Therapeutic activity, 97112- Neuromuscular re-education, 97535- Self Care, 24401- Manual therapy, [id number]- Aquatic Therapy, 97014- Electrical stimulation (unattended), [id number]- Traction (mechanical), [id number]- Ionotophoresis 4mg /ml Dexamethasone, Patient/Family education, Taping, Dry Needling, Cryotherapy, and Moist heat  PLAN FOR NEXT SESSION: postural endurance, postural strength, cervical mobility   Elizabella Nolet, PT 07/17/2023, 3:22 PM

## 2023-07-19 ENCOUNTER — Ambulatory Visit: Payer: Medicare HMO

## 2023-07-19 DIAGNOSIS — R293 Abnormal posture: Secondary | ICD-10-CM | POA: Diagnosis not present

## 2023-07-19 DIAGNOSIS — M542 Cervicalgia: Secondary | ICD-10-CM

## 2023-07-19 DIAGNOSIS — M5030 Other cervical disc degeneration, unspecified cervical region: Secondary | ICD-10-CM | POA: Diagnosis not present

## 2023-07-19 DIAGNOSIS — M4802 Spinal stenosis, cervical region: Secondary | ICD-10-CM | POA: Diagnosis not present

## 2023-07-19 DIAGNOSIS — M4012 Other secondary kyphosis, cervical region: Secondary | ICD-10-CM | POA: Diagnosis not present

## 2023-07-19 NOTE — Therapy (Signed)
OUTPATIENT PHYSICAL THERAPY CERVICAL TREATMENT   Patient Name: Derek Kent MRN: [id number] DOB:01-Mar-1956, 67 y.o., male Today's Date: 07/19/2023  END OF SESSION:  PT End of Session - 07/19/23 0844     Visit Number 3    Number of Visits 16    Date for PT Re-Evaluation 09/05/23    Authorization Type [employer] Medicare    Progress Note Due on Visit 10    PT Start Time 0845    PT Stop Time 0925    PT Time Calculation (min) 40 min    Activity Tolerance Patient tolerated treatment well    Behavior During Therapy [facility] for tasks assessed/performed             Past Medical History:  Diagnosis Date   Atypical chest pain    normal echo and nuclear stress test 2017   CKD (chronic kidney disease) stage 3, GFR 30-59 ml/min (HCC)    proteinuria   Family history of premature coronary artery disease 11/02/2015   Fatty liver    Gout    Hiatal hernia    Hypertension    PVC's (premature ventricular contractions) 11/02/2015   Past Surgical History:  Procedure Laterality Date   NO PAST SURGERIES     Patient Active Problem List   Diagnosis Date Noted   Allergic rhinitis due to pollen 10/09/2022   CKD (chronic kidney disease) stage 3, GFR 30-59 ml/min (HCC) 10/09/2022   Mitral regurgitation 11/01/2016   Family history of premature coronary artery disease 11/02/2015   Benign essential HTN 11/02/2015   PVC's (premature ventricular contractions) 11/02/2015    PCP: Sundra Aland  REFERRING PROVIDER: Patrici Ranks  REFERRING DIAG: cervical disc degeneration  THERAPY DIAG:  Neck pain  Abnormal posture  Rationale for Evaluation and Treatment: Rehabilitation  ONSET DATE: 04/2023  SUBJECTIVE:                                                                                                                                                                                                         SUBJECTIVE STATEMENT: Patient states the neck pain eases with postural  stretches.   PERTINENT HISTORY:  Pt states that he usually does 2 brisk 1 mile walks a day. In the past 3-4 months he has started having neck pain during walks. Pain doesn't happen every time and it always ends when he is done walking. He saw MD who did x rays and sent him to the neurosurgeon. Neurosurgeon recommended PT.   PAIN:  Are you having pain? Yes: NPRS scale: 0/10 currently, 7/10 at  worst/10 Pain location: center of neck/upper back Pain description: grabbing Aggravating factors: walking Relieving factors: rest  PRECAUTIONS: None  RED FLAGS: None     WEIGHT BEARING RESTRICTIONS: No  FALLS:  Has patient fallen in last 6 months? 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  DIAGNOSTIC FINDINGS:  Cervical x ray: Moderate multilevel degenerative disc disease. No acute abnormality seen.  PATIENT SURVEYS:  FOTO 65  COGNITION: Overall cognitive status: Within functional limits for tasks assessed  SENSATION: WFL  POSTURE: rounded shoulders and forward head Neck in Rt cervical sidebending at rest  PALPATION: Hypomobile C3-C7 with CPAs and lateral glides Slight mm spasticity upper traps bilat   CERVICAL ROM:   Active ROM A/PROM (deg) eval  Flexion 50  Extension 25  Right lateral flexion 25  Left lateral flexion 18  Right rotation 33  Left rotation 38   (Blank rows = not tested)  UPPER EXTREMITY MMT:  MMT Right eval Left eval  Shoulder flexion 4+ 4+  Shoulder extension    Shoulder abduction 4+ 4+  Shoulder adduction    Shoulder extension    Shoulder internal rotation    Shoulder external rotation    Middle trapezius    Lower trapezius    Elbow flexion    Elbow extension    Wrist flexion    Wrist extension    Wrist ulnar deviation    Wrist radial deviation     Wrist pronation    Wrist supination    Grip strength     (Blank rows = not tested)    TODAY'S TREATMENT:                                                                                                                              [facility] Adult PT Treatment:                                                DATE: 07/19/2023 Therapeutic Exercise: UBE L2 x 4 min alt fwd/bkwd Seated thoracic extension stretch + horiz noodle 10x5" Chin tuck with finger target + vertical noodle 2x10 Upper trap stretch 5x10" (B) SNAGS rotation 10x3" (B)  Prone I, T, Y + 1#DB 2x10 each Serratus wall slide x 12 Serratus wall clock red TB x 10 Inclined plank, gradually lowering table x5   [facility] Adult PT Treatment:                                                DATE: 07/17/23 Therapeutic  Exercise: UBE L2 x 4 min alt fwd/bkwd Bilat ER green TB x 15 W x 15 green TB Serratus wall slide x 12 Serratus wall clock red TB x 10 Elevated plank 2 x 30 sec Prone I, T, Y 2 x 10 Row 15# 15 x 3 sec hold SNAGS rotation x 10 bilat Cervical retraction x 10 Upper trap stretch 3 x 10 sec bilat   PATIENT EDUCATION:  Education details: PT POC and goals, HEP Person educated: Patient Education method: Explanation, Demonstration, and Handouts Education comprehension: verbalized understanding and returned demonstration  HOME EXERCISE PROGRAM: Access Code: [email or web address] URL: [email or web address] Date: 07/17/2023 Prepared by: Reggy Eye  Exercises - Low Trap Setting at [facility]  - 1 x daily - 4 x weekly - 3 sets - 10 reps - Shoulder W - External Rotation with Resistance  - 1 x daily - 4 x weekly - 3 sets - 10 reps - Shoulder External Rotation and Scapular Retraction with Resistance  - 1 x daily - 4 x weekly - 3 sets - 10 reps - Seated Cervical Retraction  - 1 x daily - 7 x weekly - 1 sets - 10 reps - 3 seconds hold - Shoulder Flexion Wall Slide with Resistance Band  - 1 x daily - 7 x weekly - 3 sets - 10 reps -  Plank with Hands on Table  - 1 x daily - 7 x weekly - 1 sets - 3 reps - 30 seconds hold  ASSESSMENT:  CLINICAL IMPRESSION:  Thoracic mobility and postural strengthening exercises progressed as toelrated by patient. Scapular hypomobility noted during prone Y/T/I exercises; tactile cues provided improved awareness, however patient continued to have difficulty with scapula retraction/depression mechanics.    OBJECTIVE IMPAIRMENTS: decreased activity tolerance, decreased ROM, decreased strength, and pain.    GOALS: Goals reviewed with patient? Yes  SHORT TERM GOALS: Target date: 08/08/2023  Pt will be independent in initial HEP Baseline:  Goal status: INITIAL  2.  Pt will improve cervical rotation by 10 degrees each direction Baseline:  Goal status: INITIAL   LONG TERM GOALS: Target date: 09/05/2023  Pt will be independent in advanced HEP Baseline:  Goal status: INITIAL  2.  Pt will improve FOTO to >= 72 to demo improved functional mobility Baseline:  Goal status: INITIAL  3.  Pt will tolerate walking 1 mile with pain <= 2/10 Baseline:  Goal status: INITIAL   PLAN:  PT FREQUENCY: 1-2x/week  PT DURATION: 8 weeks  PLANNED INTERVENTIONS: 97164- PT Re-evaluation, 97110-Therapeutic exercises, 97530- Therapeutic activity, 97112- Neuromuscular re-education, 97535- Self Care, 54098- Manual therapy, [id number]- Aquatic Therapy, 97014- Electrical stimulation (unattended), [id number]- Traction (mechanical), [id number]- Ionotophoresis 4mg /ml Dexamethasone, Patient/Family education, Taping, Dry Needling, Cryotherapy, and Moist heat  PLAN FOR NEXT SESSION: Progress postural endurance, postural strength, cervical mobility   Sanjuana Mae, PTA 07/19/2023, 9:26 AM

## 2023-07-20 DIAGNOSIS — J301 Allergic rhinitis due to pollen: Secondary | ICD-10-CM | POA: Diagnosis not present

## 2023-07-20 DIAGNOSIS — J3081 Allergic rhinitis due to animal (cat) (dog) hair and dander: Secondary | ICD-10-CM | POA: Diagnosis not present

## 2023-07-20 DIAGNOSIS — J3089 Other allergic rhinitis: Secondary | ICD-10-CM | POA: Diagnosis not present

## 2023-07-24 ENCOUNTER — Encounter: Payer: Self-pay | Admitting: Physical Therapy

## 2023-07-24 ENCOUNTER — Ambulatory Visit: Payer: Medicare HMO | Admitting: Physical Therapy

## 2023-07-24 DIAGNOSIS — M4802 Spinal stenosis, cervical region: Secondary | ICD-10-CM | POA: Diagnosis not present

## 2023-07-24 DIAGNOSIS — M4012 Other secondary kyphosis, cervical region: Secondary | ICD-10-CM | POA: Diagnosis not present

## 2023-07-24 DIAGNOSIS — M542 Cervicalgia: Secondary | ICD-10-CM

## 2023-07-24 DIAGNOSIS — M5030 Other cervical disc degeneration, unspecified cervical region: Secondary | ICD-10-CM | POA: Diagnosis not present

## 2023-07-24 DIAGNOSIS — R293 Abnormal posture: Secondary | ICD-10-CM | POA: Diagnosis not present

## 2023-07-24 NOTE — Therapy (Signed)
OUTPATIENT PHYSICAL THERAPY CERVICAL TREATMENT   Patient Name: Derek Kent MRN: [id number] DOB:March 04, 1956, 67 y.o., male Today's Date: 07/24/2023  END OF SESSION:  PT End of Session - 07/24/23 0924     Visit Number 4    Number of Visits 16    Date for PT Re-Evaluation 09/05/23    Authorization Type [employer]    Authorization - Visit Number 4    Progress Note Due on Visit 10    PT Start Time 0845    PT Stop Time 0925    PT Time Calculation (min) 40 min    Activity Tolerance Patient tolerated treatment well    Behavior During Therapy [facility] for tasks assessed/performed              Past Medical History:  Diagnosis Date   Atypical chest pain    normal echo and nuclear stress test 2017   CKD (chronic kidney disease) stage 3, GFR 30-59 ml/min (HCC)    proteinuria   Family history of premature coronary artery disease 11/02/2015   Fatty liver    Gout    Hiatal hernia    Hypertension    PVC's (premature ventricular contractions) 11/02/2015   Past Surgical History:  Procedure Laterality Date   NO PAST SURGERIES     Patient Active Problem List   Diagnosis Date Noted   Allergic rhinitis due to pollen 10/09/2022   CKD (chronic kidney disease) stage 3, GFR 30-59 ml/min (HCC) 10/09/2022   Mitral regurgitation 11/01/2016   Family history of premature coronary artery disease 11/02/2015   Benign essential HTN 11/02/2015   PVC's (premature ventricular contractions) 11/02/2015    PCP: Sundra Aland  REFERRING PROVIDER: Patrici Ranks  REFERRING DIAG: cervical disc degeneration  THERAPY DIAG:  Neck pain  Abnormal posture  Rationale for Evaluation and Treatment: Rehabilitation  ONSET DATE: 04/2023  SUBJECTIVE:                                                                                                                                                                                                         SUBJECTIVE STATEMENT: Pt reports he has not been  walking as much due to the rainy weather. He states the last time he walked he did get neck pain   PERTINENT HISTORY:  Pt states that he usually does 2 brisk 1 mile walks a day. In the past 3-4 months he has started having neck pain during walks. Pain doesn't happen every time and it always ends when he is done walking. He saw MD who did  x rays and sent him to the neurosurgeon. Neurosurgeon recommended PT.   PAIN:  Are you having pain? Yes: NPRS scale: 0/10 currently, 7/10 at worst/10 Pain location: center of neck/upper back Pain description: grabbing Aggravating factors: walking Relieving factors: rest  PRECAUTIONS: None  RED FLAGS: None     WEIGHT BEARING RESTRICTIONS: No  FALLS:  Has patient fallen in last 6 months? 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  DIAGNOSTIC FINDINGS:  Cervical x ray: Moderate multilevel degenerative disc disease. No acute abnormality seen.  PATIENT SURVEYS:  FOTO 65  POSTURE: rounded shoulders and forward head Neck in Rt cervical sidebending at rest  PALPATION: Hypomobile C3-C7 with CPAs and lateral glides Slight mm spasticity upper traps bilat   CERVICAL ROM:   Active ROM A/PROM (deg) eval  Flexion 50  Extension 25  Right lateral flexion 25  Left lateral flexion 18  Right rotation 33  Left rotation 38   (Blank rows = not tested)  UPPER EXTREMITY MMT:  MMT Right eval Left eval  Shoulder flexion 4+ 4+  Shoulder extension    Shoulder abduction 4+ 4+  Shoulder adduction    Shoulder extension    Shoulder internal rotation    Shoulder external rotation    Middle trapezius    Lower trapezius    Elbow flexion    Elbow extension    Wrist flexion    Wrist extension    Wrist ulnar deviation    Wrist radial deviation    Wrist pronation     Wrist supination    Grip strength     (Blank rows = not tested)    TODAY'S TREATMENT:                                                                                                                              [facility] Adult PT Treatment:                                                DATE: 07/24/23 Therapeutic Exercise: UBE L2 x 4 min alt fwd/bkwd Scap squeeze (vertical noodle) x 10 Scaption (vertical noodle) 1# x 10 --> scaption with opp UE static hold x 10 bilat Diagonals red TB x 10 bilat Prone over physioball Y,T,I x 10 On elevated table thread the needle x 10 bilat Elevated plank shoulder tap x 10 Bilateral 10# KB carry x 2 laps Suitcase carry 10# x 2 laps Gait outdoors at brisk pace up/down inclines - no increase in symptoms Serratus wall slide x 15 Wall clock red TB x 10   [facility] Adult PT Treatment:  DATE: 07/19/2023 Therapeutic Exercise: UBE L2 x 4 min alt fwd/bkwd Seated thoracic extension stretch + horiz noodle 10x5" Chin tuck with finger target + vertical noodle 2x10 Upper trap stretch 5x10" (B) SNAGS rotation 10x3" (B)  Prone I, T, Y + 1#DB 2x10 each Serratus wall slide x 12 Serratus wall clock red TB x 10 Inclined plank, gradually lowering table x5    PATIENT EDUCATION:  Education details: PT POC and goals, HEP Person educated: Patient Education method: Explanation, Demonstration, and Handouts Education comprehension: verbalized understanding and returned demonstration  HOME EXERCISE PROGRAM: Access Code: [email or web address] URL: [email or web address] Date: 07/24/2023 Prepared by: Reggy Eye  Exercises - Shoulder W - External Rotation with Resistance  - 1 x daily - 4 x weekly - 3 sets - 10 reps - Shoulder External Rotation and Scapular Retraction with Resistance  - 1 x daily - 4 x weekly - 3 sets - 10 reps - Seated Cervical Retraction  - 1 x daily - 7 x weekly - 1 sets - 10 reps - 3 seconds hold -  Shoulder Flexion Wall Slide with Resistance Band  - 1 x daily - 7 x weekly - 3 sets - 10 reps - Wall Clock with Theraband  - 1 x daily - 7 x weekly - 3 sets - 10 reps - Plank with Hands on Table  - 1 x daily - 7 x weekly - 1 sets - 3 reps - 30 seconds hold - Scaption with Dumbbells  - 1 x daily - 7 x weekly - 3 sets - 10 reps  ASSESSMENT:  CLINICAL IMPRESSION:  Pt repots increased pain on his afternoon walks vs morning walks. Session focused on improving postural endurance as well as strength. Pt with good tolerance of all activities throughout session. Updated HEP   OBJECTIVE IMPAIRMENTS: decreased activity tolerance, decreased ROM, decreased strength, and pain.    GOALS: Goals reviewed with patient? Yes  SHORT TERM GOALS: Target date: 08/08/2023  Pt will be independent in initial HEP Baseline:  Goal status: INITIAL  2.  Pt will improve cervical rotation by 10 degrees each direction Baseline:  Goal status: INITIAL   LONG TERM GOALS: Target date: 09/05/2023  Pt will be independent in advanced HEP Baseline:  Goal status: INITIAL  2.  Pt will improve FOTO to >= 72 to demo improved functional mobility Baseline:  Goal status: INITIAL  3.  Pt will tolerate walking 1 mile with pain <= 2/10 Baseline:  Goal status: INITIAL   PLAN:  PT FREQUENCY: 1-2x/week  PT DURATION: 8 weeks  PLANNED INTERVENTIONS: 97164- PT Re-evaluation, 97110-Therapeutic exercises, 97530- Therapeutic activity, 97112- Neuromuscular re-education, 97535- Self Care, 78295- Manual therapy, [id number]- Aquatic Therapy, 97014- Electrical stimulation (unattended), [id number]- Traction (mechanical), [id number]- Ionotophoresis 4mg /ml Dexamethasone, Patient/Family education, Taping, Dry Needling, Cryotherapy, and Moist heat  PLAN FOR NEXT SESSION: Progress postural endurance, postural strength, cervical mobility   Avyaan Summer, PT 07/24/2023, 9:25 AM

## 2023-07-26 ENCOUNTER — Ambulatory Visit: Payer: Medicare HMO

## 2023-07-26 DIAGNOSIS — J3089 Other allergic rhinitis: Secondary | ICD-10-CM | POA: Diagnosis not present

## 2023-07-26 DIAGNOSIS — R293 Abnormal posture: Secondary | ICD-10-CM

## 2023-07-26 DIAGNOSIS — M4802 Spinal stenosis, cervical region: Secondary | ICD-10-CM | POA: Diagnosis not present

## 2023-07-26 DIAGNOSIS — J301 Allergic rhinitis due to pollen: Secondary | ICD-10-CM | POA: Diagnosis not present

## 2023-07-26 DIAGNOSIS — M4012 Other secondary kyphosis, cervical region: Secondary | ICD-10-CM | POA: Diagnosis not present

## 2023-07-26 DIAGNOSIS — M542 Cervicalgia: Secondary | ICD-10-CM

## 2023-07-26 DIAGNOSIS — J3081 Allergic rhinitis due to animal (cat) (dog) hair and dander: Secondary | ICD-10-CM | POA: Diagnosis not present

## 2023-07-26 DIAGNOSIS — M5030 Other cervical disc degeneration, unspecified cervical region: Secondary | ICD-10-CM | POA: Diagnosis not present

## 2023-07-26 NOTE — Therapy (Signed)
OUTPATIENT PHYSICAL THERAPY CERVICAL TREATMENT   Patient Name: Derek Kent MRN: [id number] DOB:[birth year], 67 y.o., male Today's Date: 07/26/2023  END OF SESSION:  PT End of Session - 07/26/23 0851     Visit Number 5    Number of Visits 16    Date for PT Re-Evaluation 09/05/23    Authorization Type [employer]    Progress Note Due on Visit 10    PT Start Time 0850    PT Stop Time 0929    PT Time Calculation (min) 39 min    Activity Tolerance Patient tolerated treatment well    Behavior During Therapy [facility] for tasks assessed/performed              Past Medical History:  Diagnosis Date   Atypical chest pain    normal echo and nuclear stress test 2017   CKD (chronic kidney disease) stage 3, GFR 30-59 ml/min (HCC)    proteinuria   Family history of premature coronary artery disease 11/02/2015   Fatty liver    Gout    Hiatal hernia    Hypertension    PVC's (premature ventricular contractions) 11/02/2015   Past Surgical History:  Procedure Laterality Date   NO PAST SURGERIES     Patient Active Problem List   Diagnosis Date Noted   Allergic rhinitis due to pollen 10/09/2022   CKD (chronic kidney disease) stage 3, GFR 30-59 ml/min (HCC) 10/09/2022   Mitral regurgitation 11/01/2016   Family history of premature coronary artery disease 11/02/2015   Benign essential HTN 11/02/2015   PVC's (premature ventricular contractions) 11/02/2015    PCP: Sundra Aland  REFERRING PROVIDER: Patrici Ranks  REFERRING DIAG: cervical disc degeneration  THERAPY DIAG:  Neck pain  Abnormal posture  Rationale for Evaluation and Treatment: Rehabilitation  ONSET DATE: 04/2023  SUBJECTIVE:                                                                                                                                                                                                         SUBJECTIVE STATEMENT: Patient reports he had no pain on his walks yesterday, states  he worked on [employer] and replaced overhead light with no increased pain.   PERTINENT HISTORY:  Pt states that he usually does 2 brisk 1 mile walks a day. In the past 3-4 months he has started having neck pain during walks. Pain doesn't happen every time and it always ends when he is done walking. He saw MD who did x rays and sent him to the neurosurgeon. Neurosurgeon recommended  PT.   PAIN:  Are you having pain? Yes: NPRS scale: 0/10 currently, 7/10 at worst/10 Pain location: center of neck/upper back Pain description: grabbing Aggravating factors: walking Relieving factors: rest  PRECAUTIONS: None  RED FLAGS: None     WEIGHT BEARING RESTRICTIONS: No  FALLS:  Has patient fallen in last 6 months? 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  DIAGNOSTIC FINDINGS:  Cervical x ray: Moderate multilevel degenerative disc disease. No acute abnormality seen.  PATIENT SURVEYS:  FOTO 65  POSTURE: rounded shoulders and forward head Neck in Rt cervical sidebending at rest  PALPATION: Hypomobile C3-C7 with CPAs and lateral glides Slight mm spasticity upper traps bilat   CERVICAL ROM:   Active ROM A/PROM (deg) eval  Flexion 50  Extension 25  Right lateral flexion 25  Left lateral flexion 18  Right rotation 33  Left rotation 38   (Blank rows = not tested)  UPPER EXTREMITY MMT:  MMT Right eval Left eval  Shoulder flexion 4+ 4+  Shoulder extension    Shoulder abduction 4+ 4+  Shoulder adduction    Shoulder extension    Shoulder internal rotation    Shoulder external rotation    Middle trapezius    Lower trapezius    Elbow flexion    Elbow extension    Wrist flexion    Wrist extension    Wrist ulnar deviation    Wrist radial deviation    Wrist pronation    Wrist supination     Grip strength     (Blank rows = not tested)    TODAY'S TREATMENT:                                                                                                                              [facility] Adult PT Treatment:                                                DATE: 07/26/2023 Therapeutic Exercise: UBE L2 x 4 min alt fwd/bkwd Scap squeeze (vertical noodle) x 10 Single scaption arm raises + RTB x10 each 3# dowel:  high bicep curls x15 Low bicep curl x10 90 degree hold Small range lateral slides in 90 degree hold 90 deg bent elbow + small pulses Standing UT stretch + hand behind back Wall clock red TB x 8 (B) SA wall slides ("V") + lift off x10 Standing open books (hand behind head) x10 (B) Elevated plank alternating shoulder taps x 10 Elevated table thread the needle with black foam roller x 10 (B) Elevated table thread the needle + hand behind head --> rotating up/down x8 (B)  [facility] Adult PT Treatment:                                                DATE: 07/24/23 Therapeutic Exercise: UBE L2 x 4 min alt fwd/bkwd Scap squeeze (vertical noodle) x 10 Scaption (vertical noodle) 1# x 10 --> scaption with opp UE static hold x 10 bilat Diagonals red TB x 10 bilat Prone over physioball Y,T,I x 10 On elevated table thread the needle x 10 bilat Elevated plank shoulder tap x 10 Bilateral 10# KB carry x 2 laps Suitcase carry 10# x 2 laps Gait outdoors at brisk pace up/down inclines - no increase in symptoms Serratus wall slide x 15 Wall clock red TB x 10   [facility] Adult PT Treatment:                                                DATE: 07/19/2023 Therapeutic Exercise: UBE L2 x 4 min alt fwd/bkwd Seated thoracic extension stretch + horiz noodle 10x5" Chin tuck with finger target + vertical noodle 2x10 Upper trap stretch 5x10" (B) SNAGS rotation 10x3" (B)  Prone I, T, Y + 1#DB 2x10 each Serratus wall slide x 12 Serratus wall clock red TB x 10 Inclined plank, gradually  lowering table x5    PATIENT EDUCATION:  Education details: Updated HEP Person educated: Patient Education method: Explanation, Demonstration, and Handouts Education comprehension: verbalized understanding and returned demonstration  HOME EXERCISE PROGRAM: Access Code: [email or web address] URL: [email or web address] Date: 07/24/2023 Prepared by: Reggy Eye  Exercises - Shoulder W - External Rotation with Resistance  - 1 x daily - 4 x weekly - 3 sets - 10 reps - Shoulder External Rotation and Scapular Retraction with Resistance  - 1 x daily - 4 x weekly - 3 sets - 10 reps - Seated Cervical Retraction  - 1 x daily - 7 x weekly - 1 sets - 10 reps - 3 seconds hold - Shoulder Flexion Wall Slide with Resistance Band  - 1 x daily - 7 x weekly - 3 sets - 10 reps - Wall Clock with Theraband  - 1 x daily - 7 x weekly - 3 sets - 10 reps - Plank with Hands on Table  - 1 x daily - 7 x weekly - 1 sets - 3 reps - 30 seconds hold - Scaption with Dumbbells  - 1 x daily - 7 x weekly - 3 sets - 10 reps  ASSESSMENT:  CLINICAL IMPRESSION:  Postural exercises progressed as tolerated by patient; session focused on improving upper back stabilization with overhead arm movements without upper trapezius compensation. Thoracolumbar mobility exercises progressed with standing trunk rotation variations.   OBJECTIVE IMPAIRMENTS: decreased activity tolerance, decreased ROM, decreased strength, and pain.    GOALS: Goals reviewed with patient? Yes  SHORT TERM GOALS: Target date: 08/08/2023  Pt will be independent in initial HEP Baseline:  Goal status: INITIAL  2.  Pt will improve cervical rotation by 10 degrees each direction Baseline:  Goal status: INITIAL   LONG TERM GOALS: Target date: 09/05/2023  Pt will be independent in advanced HEP Baseline:  Goal status: INITIAL  2.  Pt will improve FOTO to >= 72 to demo improved functional mobility Baseline:  Goal status: INITIAL  3.  Pt will  tolerate walking 1 mile with pain <= 2/10 Baseline:  Goal status: INITIAL   PLAN:  PT FREQUENCY: 1-2x/week  PT DURATION: 8 weeks  PLANNED INTERVENTIONS: 97164- PT Re-evaluation, 97110-Therapeutic exercises, 97530- Therapeutic activity, 97112- Neuromuscular re-education, 97535- Self Care, 09811- Manual therapy, [id number]- Aquatic Therapy, 97014- Electrical stimulation (unattended), [id number]- Traction (mechanical), [id number]- Ionotophoresis 4mg /ml Dexamethasone, Patient/Family education, Taping, Dry Needling, Cryotherapy, and Moist heat  PLAN FOR NEXT SESSION: Progress postural endurance, postural strength, cervical mobility   Sanjuana Mae, PTA 07/26/2023, 9:29 AM

## 2023-07-30 ENCOUNTER — Encounter: Payer: Self-pay | Admitting: Physical Therapy

## 2023-07-30 ENCOUNTER — Ambulatory Visit: Payer: Medicare HMO | Admitting: Physical Therapy

## 2023-07-30 DIAGNOSIS — M4802 Spinal stenosis, cervical region: Secondary | ICD-10-CM | POA: Diagnosis not present

## 2023-07-30 DIAGNOSIS — R293 Abnormal posture: Secondary | ICD-10-CM

## 2023-07-30 DIAGNOSIS — M5030 Other cervical disc degeneration, unspecified cervical region: Secondary | ICD-10-CM | POA: Diagnosis not present

## 2023-07-30 DIAGNOSIS — M542 Cervicalgia: Secondary | ICD-10-CM

## 2023-07-30 DIAGNOSIS — M4012 Other secondary kyphosis, cervical region: Secondary | ICD-10-CM | POA: Diagnosis not present

## 2023-07-30 NOTE — Therapy (Signed)
OUTPATIENT PHYSICAL THERAPY CERVICAL TREATMENT   Patient Name: Derek Kent MRN: [id number] DOB:1955-10-05, 67 y.o., male Today's Date: 07/30/2023  END OF SESSION:  PT End of Session - 07/30/23 1058     Visit Number 6    Number of Visits 16    Date for PT Re-Evaluation 09/05/23    Authorization Type [employer]    Authorization - Visit Number 6    Progress Note Due on Visit 10    PT Start Time 1015    PT Stop Time 1056    PT Time Calculation (min) 41 min    Activity Tolerance Patient tolerated treatment well    Behavior During Therapy [facility] for tasks assessed/performed               Past Medical History:  Diagnosis Date   Atypical chest pain    normal echo and nuclear stress test 2017   CKD (chronic kidney disease) stage 3, GFR 30-59 ml/min (HCC)    proteinuria   Family history of premature coronary artery disease 11/02/2015   Fatty liver    Gout    Hiatal hernia    Hypertension    PVC's (premature ventricular contractions) 11/02/2015   Past Surgical History:  Procedure Laterality Date   NO PAST SURGERIES     Patient Active Problem List   Diagnosis Date Noted   Allergic rhinitis due to pollen 10/09/2022   CKD (chronic kidney disease) stage 3, GFR 30-59 ml/min (HCC) 10/09/2022   Mitral regurgitation 11/01/2016   Family history of premature coronary artery disease 11/02/2015   Benign essential HTN 11/02/2015   PVC's (premature ventricular contractions) 11/02/2015    PCP: Sundra Aland  REFERRING PROVIDER: Patrici Ranks  REFERRING DIAG: cervical disc degeneration  THERAPY DIAG:  Neck pain  Abnormal posture  Rationale for Evaluation and Treatment: Rehabilitation  ONSET DATE: 04/2023  SUBJECTIVE:                                                                                                                                                                                                         SUBJECTIVE STATEMENT: Patient reports he had  pain on an afternoon walk yesterday. The pain usually occurs after going up or down an incline   PERTINENT HISTORY:  Pt states that he usually does 2 brisk 1 mile walks a day. In the past 3-4 months he has started having neck pain during walks. Pain doesn't happen every time and it always ends when he is done walking. He saw MD who did x rays and sent  him to the neurosurgeon. Neurosurgeon recommended PT.   PAIN:  Are you having pain? Yes: NPRS scale: 0/10 currently, 7/10 at worst/10 Pain location: center of neck/upper back Pain description: grabbing Aggravating factors: walking Relieving factors: rest  PRECAUTIONS: None  RED FLAGS: None     WEIGHT BEARING RESTRICTIONS: No  FALLS:  Has patient fallen in last 6 months? 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  DIAGNOSTIC FINDINGS:  Cervical x ray: Moderate multilevel degenerative disc disease. No acute abnormality seen.  PATIENT SURVEYS:  FOTO 65  POSTURE: rounded shoulders and forward head Neck in Rt cervical sidebending at rest  PALPATION: Hypomobile C3-C7 with CPAs and lateral glides Slight mm spasticity upper traps bilat   CERVICAL ROM:   Active ROM A/PROM (deg) eval AROM 12/23  Flexion 50   Extension 25 35  Right lateral flexion 25 35  Left lateral flexion 18 27  Right rotation 33   Left rotation 38    (Blank rows = not tested)  UPPER EXTREMITY MMT:  MMT Right eval Left eval  Shoulder flexion 4+ 4+  Shoulder extension    Shoulder abduction 4+ 4+  Shoulder adduction    Shoulder extension    Shoulder internal rotation    Shoulder external rotation    Middle trapezius    Lower trapezius    Elbow flexion    Elbow extension    Wrist flexion    Wrist extension    Wrist ulnar deviation    Wrist radial deviation     Wrist pronation    Wrist supination    Grip strength     (Blank rows = not tested)    TODAY'S TREATMENT:                                                                                                                              [facility] Adult PT Treatment:                                                DATE: 07/30/23 Therapeutic Exercise: UBE L3 x 4 min alt fwd/bkwd Serratus push up x 15 Scaption with opp UE hold 3# x 10 Flexion with opp UE hold 3# x 10 Bottoms up 5#KB carry x 2 laps Wall angel x 10 - difficult Lower trap wall lift offs x 15 Elevated table thread the needle with elbow bent x 10 bilat Cervical extension with towel x 10 UT stretch with hand behind back Squats on incline and decline board x 12 each - no increase in symptoms Unilateral horizontal abd green TB 2 x 10 Elevated table plank with alt shoulder taps  [facility] Adult PT Treatment:                                                DATE: 07/26/2023 Therapeutic Exercise: UBE L2 x 4 min alt fwd/bkwd Scap squeeze (vertical noodle) x 10 Single scaption arm raises + RTB x10 each 3# dowel:  high bicep curls x15 Low bicep curl x10 90 degree hold Small range lateral slides in 90 degree hold 90 deg bent elbow + small pulses Standing UT stretch + hand behind back Wall clock red TB x 8 (B) SA wall slides ("V") + lift off x10 Standing open books (hand behind head) x10 (B) Elevated plank alternating shoulder taps x 10 Elevated table thread the needle with black foam roller x 10 (B) Elevated table thread the needle + hand behind head --> rotating up/down x8 (B)   [facility] Adult PT Treatment:                                                DATE: 07/24/23 Therapeutic Exercise: UBE L2 x 4 min alt fwd/bkwd Scap squeeze (vertical noodle) x 10 Scaption (vertical noodle) 1# x 10 --> scaption with opp UE static hold x 10 bilat Diagonals red TB x 10 bilat Prone over physioball Y,T,I x 10 On elevated table thread the needle x 10  bilat Elevated plank shoulder tap x 10 Bilateral 10# KB carry x 2 laps Suitcase carry 10# x 2 laps Gait outdoors at brisk pace up/down inclines - no increase in symptoms Serratus wall slide x 15 Wall clock red TB x 10    PATIENT EDUCATION:  Education details: Updated HEP Person educated: Patient Education method: Explanation, Demonstration, and Handouts Education comprehension: verbalized understanding and returned demonstration  HOME EXERCISE PROGRAM: Access Code: [email or web address] URL: [email or web address] Date: 07/24/2023 Prepared by: Reggy Eye  Exercises - Shoulder W - External Rotation with Resistance  - 1 x daily - 4 x weekly - 3 sets - 10 reps - Shoulder External Rotation and Scapular Retraction with Resistance  - 1 x daily - 4 x weekly - 3 sets - 10 reps - Seated Cervical Retraction  - 1 x daily - 7 x weekly - 1 sets - 10 reps - 3 seconds hold - Shoulder Flexion Wall Slide with Resistance Band  - 1 x daily - 7 x weekly - 3 sets - 10 reps - Wall Clock with Theraband  - 1 x daily - 7 x weekly - 3 sets - 10 reps - Plank with Hands on Table  - 1 x daily - 7 x weekly - 1 sets - 3 reps - 30 seconds hold - Scaption with Dumbbells  - 1 x daily - 7 x weekly - 3 sets - 10 reps  ASSESSMENT:  CLINICAL IMPRESSION:  Pt has improved cervical ROM and is improving postural endurance. No pain with any activities during session. Pt states he thinks his pain may be related to GI issues as sometimes burping relieves pain. He may try taking an antacid prior to walking and see if this changes pain. PT encouraged pt to return to MD to address GI issues   OBJECTIVE IMPAIRMENTS: decreased activity tolerance, decreased ROM, decreased strength, and pain.  GOALS: Goals reviewed with patient? Yes  SHORT TERM GOALS: Target date: 08/08/2023  Pt will be independent in initial HEP Baseline:  Goal status: MET  2.  Pt will improve cervical rotation by 10 degrees each  direction Baseline:  Goal status: MET   LONG TERM GOALS: Target date: 09/05/2023  Pt will be independent in advanced HEP Baseline:  Goal status: INITIAL  2.  Pt will improve FOTO to >= 72 to demo improved functional mobility Baseline:  Goal status: INITIAL  3.  Pt will tolerate walking 1 mile with pain <= 2/10 Baseline:  Goal status: INITIAL   PLAN:  PT FREQUENCY: 1-2x/week  PT DURATION: 8 weeks  PLANNED INTERVENTIONS: 97164- PT Re-evaluation, 97110-Therapeutic exercises, 97530- Therapeutic activity, 97112- Neuromuscular re-education, 97535- Self Care, 82956- Manual therapy, [id number]- Aquatic Therapy, 97014- Electrical stimulation (unattended), [id number]- Traction (mechanical), [id number]- Ionotophoresis 4mg /ml Dexamethasone, Patient/Family education, Taping, Dry Needling, Cryotherapy, and Moist heat  PLAN FOR NEXT SESSION: Progress postural endurance, postural strength, cervical mobility   Holley Wirt, PT 07/30/2023, 10:58 AM

## 2023-08-02 ENCOUNTER — Ambulatory Visit: Payer: Medicare HMO

## 2023-08-02 DIAGNOSIS — M4012 Other secondary kyphosis, cervical region: Secondary | ICD-10-CM | POA: Diagnosis not present

## 2023-08-02 DIAGNOSIS — M4802 Spinal stenosis, cervical region: Secondary | ICD-10-CM | POA: Diagnosis not present

## 2023-08-02 DIAGNOSIS — M542 Cervicalgia: Secondary | ICD-10-CM

## 2023-08-02 DIAGNOSIS — M5030 Other cervical disc degeneration, unspecified cervical region: Secondary | ICD-10-CM | POA: Diagnosis not present

## 2023-08-02 DIAGNOSIS — R293 Abnormal posture: Secondary | ICD-10-CM | POA: Diagnosis not present

## 2023-08-02 NOTE — Therapy (Signed)
OUTPATIENT PHYSICAL THERAPY CERVICAL TREATMENT   Patient Name: Derek Kent MRN: [id number] DOB:13-Aug-1955, 67 y.o., male Today's Date: 08/02/2023  END OF SESSION:  PT End of Session - 08/02/23 0843     Visit Number 7    Number of Visits 16    Date for PT Re-Evaluation 09/05/23    Authorization Type [employer] Medicare    Progress Note Due on Visit 10    PT Start Time 0845    PT Stop Time 0926    PT Time Calculation (min) 41 min    Activity Tolerance Patient tolerated treatment well    Behavior During Therapy [facility] for tasks assessed/performed            Past Medical History:  Diagnosis Date   Atypical chest pain    normal echo and nuclear stress test 2017   CKD (chronic kidney disease) stage 3, GFR 30-59 ml/min (HCC)    proteinuria   Family history of premature coronary artery disease 11/02/2015   Fatty liver    Gout    Hiatal hernia    Hypertension    PVC's (premature ventricular contractions) 11/02/2015   Past Surgical History:  Procedure Laterality Date   NO PAST SURGERIES     Patient Active Problem List   Diagnosis Date Noted   Allergic rhinitis due to pollen 10/09/2022   CKD (chronic kidney disease) stage 3, GFR 30-59 ml/min (HCC) 10/09/2022   Mitral regurgitation 11/01/2016   Family history of premature coronary artery disease 11/02/2015   Benign essential HTN 11/02/2015   PVC's (premature ventricular contractions) 11/02/2015    PCP: Sundra Aland  REFERRING PROVIDER: Patrici Ranks  REFERRING DIAG: cervical disc degeneration  THERAPY DIAG:  Neck pain  Abnormal posture  Rationale for Evaluation and Treatment: Rehabilitation  ONSET DATE: 04/2023  SUBJECTIVE:                                                                                                                                                                                                         SUBJECTIVE STATEMENT: Patient reports he plans on trying to take antacid prior to  walking to see of it makes a difference with pain during walking.    PERTINENT HISTORY:  Pt states that he usually does 2 brisk 1 mile walks a day. In the past 3-4 months he has started having neck pain during walks. Pain doesn't happen every time and it always ends when he is done walking. He saw MD who did x rays and sent him to the neurosurgeon. Neurosurgeon recommended PT.  PAIN:  Are you having pain? Yes: NPRS scale: 0/10 currently, 7/10 at worst/10 Pain location: center of neck/upper back Pain description: grabbing Aggravating factors: walking Relieving factors: rest  PRECAUTIONS: None  RED FLAGS: None     WEIGHT BEARING RESTRICTIONS: No  FALLS:  Has patient fallen in last 6 months? 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  DIAGNOSTIC FINDINGS:  Cervical x ray: Moderate multilevel degenerative disc disease. No acute abnormality seen.  PATIENT SURVEYS:  FOTO 65  POSTURE: rounded shoulders and forward head Neck in Rt cervical sidebending at rest  PALPATION: Hypomobile C3-C7 with CPAs and lateral glides Slight mm spasticity upper traps bilat   CERVICAL ROM:   Active ROM A/PROM (deg) eval AROM 12/23  Flexion 50   Extension 25 35  Right lateral flexion 25 35  Left lateral flexion 18 27  Right rotation 33   Left rotation 38    (Blank rows = not tested)  UPPER EXTREMITY MMT:  MMT Right eval Left eval  Shoulder flexion 4+ 4+  Shoulder extension    Shoulder abduction 4+ 4+  Shoulder adduction    Shoulder extension    Shoulder internal rotation    Shoulder external rotation    Middle trapezius    Lower trapezius    Elbow flexion    Elbow extension    Wrist flexion    Wrist extension    Wrist ulnar deviation    Wrist radial deviation    Wrist pronation     Wrist supination    Grip strength     (Blank rows = not tested)    TODAY'S TREATMENT:                                                                                                                              [facility] Adult PT Treatment:                                                DATE: 08/02/2023 Therapeutic Exercise: UBE L4 x fwd/10min bkwd UT & LS stretch variations Self-massage with theracane Standing thread the needle with black foam roller 10x5" (B) Thoracic extension stretch at wall --> hands behind neck, elbows slide up/down wall 10x5" Double arm boxing + blue TB x15 Straight arm raises from 90 degrees x10 Rows + blue TB 2x15 Wall angel arm slides x 10 Lower trap wall lift offs x 15   [facility] Adult PT Treatment:  DATE: 07/30/23 Therapeutic Exercise: UBE L3 x 4 min alt fwd/bkwd Serratus push up x 15 Scaption with opp UE hold 3# x 10 Flexion with opp UE hold 3# x 10 Bottoms up 5#KB carry x 2 laps Wall angel x 10 - difficult Lower trap wall lift offs x 15 Elevated table thread the needle with elbow bent x 10 bilat Cervical extension with towel x 10 UT stretch with hand behind back Squats on incline and decline board x 12 each - no increase in symptoms Unilateral horizontal abd green TB 2 x 10 Elevated table plank with alt shoulder taps   [facility] Adult PT Treatment:                                                DATE: 07/26/2023 Therapeutic Exercise: UBE L2 x 4 min alt fwd/bkwd Scap squeeze (vertical noodle) x 10 Single scaption arm raises + RTB x10 each 3# dowel:  high bicep curls x15 Low bicep curl x10 90 degree hold Small range lateral slides in 90 degree hold 90 deg bent elbow + small pulses Standing UT stretch + hand behind back Wall clock red TB x 8 (B) SA wall slides ("V") + lift off x10 Standing open books (hand behind head) x10 (B) Elevated plank alternating shoulder taps x 10 Elevated table thread the  needle with black foam roller x 10 (B) Elevated table thread the needle + hand behind head --> rotating up/down x8 (B)   PATIENT EDUCATION:  Education details: Updated HEP Person educated: Patient Education method: Explanation, Demonstration, and Handouts Education comprehension: verbalized understanding and returned demonstration  HOME EXERCISE PROGRAM: Access Code: [email or web address] URL: [email or web address] Date: 08/02/2023 Prepared by: Carlynn Herald  Exercises - Shoulder W - External Rotation with Resistance  - 1 x daily - 4 x weekly - 3 sets - 10 reps - Shoulder External Rotation and Scapular Retraction with Resistance  - 1 x daily - 4 x weekly - 3 sets - 10 reps - Seated Cervical Retraction  - 1 x daily - 7 x weekly - 1 sets - 10 reps - 3 seconds hold - Shoulder Flexion Wall Slide with Resistance Band  - 1 x daily - 7 x weekly - 3 sets - 10 reps - Plank with Hands on Table  - 1 x daily - 7 x weekly - 1 sets - 3 reps - 30 seconds hold - Wall Clock with Theraband  - 1 x daily - 7 x weekly - 3 sets - 10 reps - Scaption with Dumbbells  - 1 x daily - 7 x weekly - 3 sets - 10 reps - Upper Trapezius Stretch  - 1 x daily - 7 x weekly - 2 sets - 3-5 reps - 10 -30 sec hold - Seated Levator Scapulae Stretch  - 1 x daily - 7 x weekly - 2 sets - 3-5 reps - 10-30 ec hold - Standing Thoracic Extension at Wall  - 1 x daily - 7 x weekly - 3 sets - 10 reps - 5 sec hold - Corner Pec Major Stretch  - 1 x daily - 7 x weekly - 3 sets - 3-5 reps - 10-30 sec hold  ASSESSMENT:  CLINICAL IMPRESSION:  Thoracic extension mobility and pec stretches added to HEP to progress upright postural alignment. Increased symptoms reported during wall angle arm slides; patient  recovered quickly with rest break.  OBJECTIVE IMPAIRMENTS: decreased activity tolerance, decreased ROM, decreased strength, and pain.    GOALS: Goals reviewed with patient? Yes  SHORT TERM GOALS: Target date: 08/08/2023  Pt will be  independent in initial HEP Baseline:  Goal status: MET  2.  Pt will improve cervical rotation by 10 degrees each direction Baseline:  Goal status: MET   LONG TERM GOALS: Target date: 09/05/2023  Pt will be independent in advanced HEP Baseline:  Goal status: INITIAL  2.  Pt will improve FOTO to >= 72 to demo improved functional mobility Baseline: 63 Goal status: IN PROGRESS  3.  Pt will tolerate walking 1 mile with pain <= 2/10 Baseline:  Goal status: INITIAL   PLAN:  PT FREQUENCY: 1-2x/week  PT DURATION: 8 weeks  PLANNED INTERVENTIONS: 97164- PT Re-evaluation, 97110-Therapeutic exercises, 97530- Therapeutic activity, 97112- Neuromuscular re-education, 97535- Self Care, [id number]- Manual therapy, [id number]- Aquatic Therapy, 97014- Electrical stimulation (unattended), [id number]- Traction (mechanical), [id number]- Ionotophoresis 4mg /ml Dexamethasone, Patient/Family education, Taping, Dry Needling, Cryotherapy, and Moist heat  PLAN FOR NEXT SESSION: Progress postural endurance, postural strength, cervical mobility   Sanjuana Mae, PTA 08/02/2023, 9:27 AM

## 2023-08-06 ENCOUNTER — Ambulatory Visit: Payer: Medicare HMO

## 2023-08-06 DIAGNOSIS — M4012 Other secondary kyphosis, cervical region: Secondary | ICD-10-CM | POA: Diagnosis not present

## 2023-08-06 DIAGNOSIS — R293 Abnormal posture: Secondary | ICD-10-CM | POA: Diagnosis not present

## 2023-08-06 DIAGNOSIS — M542 Cervicalgia: Secondary | ICD-10-CM

## 2023-08-06 DIAGNOSIS — M4802 Spinal stenosis, cervical region: Secondary | ICD-10-CM | POA: Diagnosis not present

## 2023-08-06 DIAGNOSIS — M5030 Other cervical disc degeneration, unspecified cervical region: Secondary | ICD-10-CM | POA: Diagnosis not present

## 2023-08-06 NOTE — Therapy (Signed)
OUTPATIENT PHYSICAL THERAPY CERVICAL TREATMENT   Patient Name: Derek Kent MRN: [id number] DOB:1955/12/03, 67 y.o., male Today's Date: 08/06/2023  END OF SESSION:  PT End of Session - 08/06/23 0847     Visit Number 8    Number of Visits 16    Date for PT Re-Evaluation 09/05/23    Authorization Type [employer]    Progress Note Due on Visit 10    PT Start Time [phone]    PT Stop Time 0930    PT Time Calculation (min) 43 min    Activity Tolerance Patient tolerated treatment well    Behavior During Therapy [facility] for tasks assessed/performed            Past Medical History:  Diagnosis Date   Atypical chest pain    normal echo and nuclear stress test 2017   CKD (chronic kidney disease) stage 3, GFR 30-59 ml/min (HCC)    proteinuria   Family history of premature coronary artery disease 11/02/2015   Fatty liver    Gout    Hiatal hernia    Hypertension    PVC's (premature ventricular contractions) 11/02/2015   Past Surgical History:  Procedure Laterality Date   NO PAST SURGERIES     Patient Active Problem List   Diagnosis Date Noted   Allergic rhinitis due to pollen 10/09/2022   CKD (chronic kidney disease) stage 3, GFR 30-59 ml/min (HCC) 10/09/2022   Mitral regurgitation 11/01/2016   Family history of premature coronary artery disease 11/02/2015   Benign essential HTN 11/02/2015   PVC's (premature ventricular contractions) 11/02/2015    PCP: Sundra Aland  REFERRING PROVIDER: Patrici Ranks  REFERRING DIAG: cervical disc degeneration  THERAPY DIAG:  Neck pain  Abnormal posture  Rationale for Evaluation and Treatment: Rehabilitation  ONSET DATE: 04/2023  SUBJECTIVE:                                                                                                                                                                                                         SUBJECTIVE STATEMENT: Patient reports he continues to have intermittent neck pain when  walking, as well as burping occasionally when walking.   PERTINENT HISTORY:  Pt states that he usually does 2 brisk 1 mile walks a day. In the past 3-4 months he has started having neck pain during walks. Pain doesn't happen every time and it always ends when he is done walking. He saw MD who did x rays and sent him to the neurosurgeon. Neurosurgeon recommended PT.   PAIN:  Are you having  pain? Yes: NPRS scale: 0/10 currently, 7/10 at worst/10 Pain location: center of neck/upper back Pain description: grabbing Aggravating factors: walking Relieving factors: rest  PRECAUTIONS: None  RED FLAGS: None     WEIGHT BEARING RESTRICTIONS: No  FALLS:  Has patient fallen in last 6 months? 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  DIAGNOSTIC FINDINGS:  Cervical x ray: Moderate multilevel degenerative disc disease. No acute abnormality seen.  PATIENT SURVEYS:  FOTO 65  POSTURE: rounded shoulders and forward head Neck in Rt cervical sidebending at rest  PALPATION: Hypomobile C3-C7 with CPAs and lateral glides Slight mm spasticity upper traps bilat   CERVICAL ROM:   Active ROM A/PROM (deg) eval AROM 12/23  Flexion 50   Extension 25 35  Right lateral flexion 25 35  Left lateral flexion 18 27  Right rotation 33   Left rotation 38    (Blank rows = not tested)  UPPER EXTREMITY MMT:  MMT Right eval Left eval  Shoulder flexion 4+ 4+  Shoulder extension    Shoulder abduction 4+ 4+  Shoulder adduction    Shoulder extension    Shoulder internal rotation    Shoulder external rotation    Middle trapezius    Lower trapezius    Elbow flexion    Elbow extension    Wrist flexion    Wrist extension    Wrist ulnar deviation    Wrist radial deviation    Wrist pronation    Wrist  supination    Grip strength     (Blank rows = not tested)    TODAY'S TREATMENT:                                                                                                                              [facility] Adult PT Treatment:                                                DATE: 08/06/2023 Therapeutic Exercise: UBE L4 x fwd/24min bkwd Standing thread the needle with black foam roller 10x5" (B) Lower trap wall lift offs x10 Thoracic extension stretch at wall --> hands behind neck, elbows slide up/down wall 10x5" Quadruped: Alt single arm raises Unilateral hip extension kick out Bird dog Standing rows + blue TB 2x15 Multifidi pulses + GTB x20 Resisted walking fwd/bkwd 15# --> bkwdfwd 10# --> lateral 10#    [facility] Adult PT Treatment:  DATE: 08/02/2023 Therapeutic Exercise: UBE L4 x fwd/34min bkwd UT & LS stretch variations Self-massage with theracane Standing thread the needle with black foam roller 10x5" (B) Thoracic extension stretch at wall --> hands behind neck, elbows slide up/down wall 10x5" Double arm boxing + blue TB x15 Straight arm raises from 90 degrees x10 Rows + blue TB 2x15 Wall angel arm slides x 10 Lower trap wall lift offs x 15   [facility] Adult PT Treatment:                                                DATE: 07/30/23 Therapeutic Exercise: UBE L3 x 4 min alt fwd/bkwd Serratus push up x 15 Scaption with opp UE hold 3# x 10 Flexion with opp UE hold 3# x 10 Bottoms up 5#KB carry x 2 laps Wall angel x 10 - difficult Lower trap wall lift offs x 15 Elevated table thread the needle with elbow bent x 10 bilat Cervical extension with towel x 10 UT stretch with hand behind back Squats on incline and decline board x 12 each - no increase in symptoms Unilateral horizontal abd green TB 2 x 10 Elevated table plank with alt shoulder taps   PATIENT EDUCATION:  Education details: Updated HEP Person educated:  Patient Education method: Explanation, Demonstration, and Handouts Education comprehension: verbalized understanding and returned demonstration  HOME EXERCISE PROGRAM: Access Code: [email or web address] URL: [email or web address] Date: 08/06/2023 Prepared by: Carlynn Herald  Exercises - Shoulder W - External Rotation with Resistance  - 1 x daily - 4 x weekly - 3 sets - 10 reps - Shoulder External Rotation and Scapular Retraction with Resistance  - 1 x daily - 4 x weekly - 3 sets - 10 reps - Seated Cervical Retraction  - 1 x daily - 7 x weekly - 1 sets - 10 reps - 3 seconds hold - Shoulder Flexion Wall Slide with Resistance Band  - 1 x daily - 7 x weekly - 3 sets - 10 reps - Plank with Hands on Table  - 1 x daily - 7 x weekly - 1 sets - 3 reps - 30 seconds hold - Wall Clock with Theraband  - 1 x daily - 7 x weekly - 3 sets - 10 reps - Scaption with Dumbbells  - 1 x daily - 7 x weekly - 3 sets - 10 reps - Upper Trapezius Stretch  - 1 x daily - 7 x weekly - 2 sets - 3-5 reps - 10 -30 sec hold - Seated Levator Scapulae Stretch  - 1 x daily - 7 x weekly - 2 sets - 3-5 reps - 10-30 ec hold - Standing Thoracic Extension at Wall  - 1 x daily - 7 x weekly - 3 sets - 10 reps - 5 sec hold - Corner Pec Major Stretch  - 1 x daily - 7 x weekly - 3 sets - 3-5 reps - 10-30 sec hold - Bird Dog  - 1 x daily - 7 x weekly - 3 sets - 10 reps  ASSESSMENT:  CLINICAL IMPRESSION:  Quadruped exercises incorporated to progress core and postural stabilization; noted instability with weight bearing on L LE kneeling during bird dog exercise. Resisted walking in multiple directions added to challenge core stabilization and dynamic balance.    OBJECTIVE IMPAIRMENTS: decreased activity tolerance, decreased ROM, decreased strength, and  pain.    GOALS: Goals reviewed with patient? Yes  SHORT TERM GOALS: Target date: 08/08/2023  Pt will be independent in initial HEP Baseline:  Goal status: MET  2.  Pt will  improve cervical rotation by 10 degrees each direction Baseline:  Goal status: MET   LONG TERM GOALS: Target date: 09/05/2023  Pt will be independent in advanced HEP Baseline:  Goal status: INITIAL  2.  Pt will improve FOTO to >= 72 to demo improved functional mobility Baseline: 63 Goal status: IN PROGRESS  3.  Pt will tolerate walking 1 mile with pain <= 2/10 Baseline: 7/10 Goal status: IN PROGRESS   PLAN:  PT FREQUENCY: 1-2x/week  PT DURATION: 8 weeks  PLANNED INTERVENTIONS: 97164- PT Re-evaluation, 97110-Therapeutic exercises, 97530- Therapeutic activity, [id number]- Neuromuscular re-education, 97535- Self Care, 32440- Manual therapy, [id number]- Aquatic Therapy, 97014- Electrical stimulation (unattended), [id number]- Traction (mechanical), [id number]- Ionotophoresis 4mg /ml Dexamethasone, Patient/Family education, Taping, Dry Needling, Cryotherapy, and Moist heat  PLAN FOR NEXT SESSION: Progress postural endurance, postural strength, cervical mobility. Core strengthening, lifting/bending mechanics. (Will take week off from PT to determine of needing to continue)   Sanjuana Mae, PTA 08/06/2023, 9:30 AM

## 2023-08-09 ENCOUNTER — Ambulatory Visit: Payer: Medicare HMO | Attending: Surgery

## 2023-08-09 DIAGNOSIS — M542 Cervicalgia: Secondary | ICD-10-CM | POA: Insufficient documentation

## 2023-08-09 DIAGNOSIS — R293 Abnormal posture: Secondary | ICD-10-CM | POA: Insufficient documentation

## 2023-08-09 NOTE — Therapy (Signed)
 OUTPATIENT PHYSICAL THERAPY CERVICAL TREATMENT   Patient Name: COSME JACOB MRN: [id number] DOB:[birth year], 68 y.o., male Today's Date: 08/09/2023  END OF SESSION:  PT End of Session - 08/09/23 1014     Visit Number 9    Number of Visits 16    Date for PT Re-Evaluation 09/05/23    Authorization Type [employer] Medicare    Progress Note Due on Visit 10    PT Start Time 1015    PT Stop Time 1056    PT Time Calculation (min) 41 min    Activity Tolerance Patient tolerated treatment well    Behavior During Therapy [facility] for tasks assessed/performed            Past Medical History:  Diagnosis Date   Atypical chest pain    normal echo and nuclear stress test 2017   CKD (chronic kidney disease) stage 3, GFR 30-59 ml/min (HCC)    proteinuria   Family history of premature coronary artery disease 11/02/2015   Fatty liver    Gout    Hiatal hernia    Hypertension    PVC's (premature ventricular contractions) 11/02/2015   Past Surgical History:  Procedure Laterality Date   NO PAST SURGERIES     Patient Active Problem List   Diagnosis Date Noted   Allergic rhinitis due to pollen 10/09/2022   CKD (chronic kidney disease) stage 3, GFR 30-59 ml/min (HCC) 10/09/2022   Mitral regurgitation 11/01/2016   Family history of premature coronary artery disease 11/02/2015   Benign essential HTN 11/02/2015   PVC's (premature ventricular contractions) 11/02/2015    PCP: Colette Menghini  REFERRING PROVIDER: Johnanna Credit  REFERRING DIAG: cervical disc degeneration  THERAPY DIAG:  Neck pain  Abnormal posture  Rationale for Evaluation and Treatment: Rehabilitation  ONSET DATE: 04/2023  SUBJECTIVE:                                                                                                                                                                                                         SUBJECTIVE STATEMENT: Patient reports he took a brisk walk with inclines yesterday and  had no neck pain.    PERTINENT HISTORY:  Pt states that he usually does 2 brisk 1 mile walks a day. In the past 3-4 months he has started having neck pain during walks. Pain doesn't happen every time and it always ends when he is done walking. He saw MD who did x rays and sent him to the neurosurgeon. Neurosurgeon recommended PT.   PAIN:  Are you having pain? Yes:  NPRS scale: 0/10 currently, 7/10 at worst/10 Pain location: center of neck/upper back Pain description: grabbing Aggravating factors: walking Relieving factors: rest  PRECAUTIONS: None  RED FLAGS: None     WEIGHT BEARING RESTRICTIONS: No  FALLS:  Has patient fallen in last 6 months? No   OCCUPATION: retired from animator work  PLOF: Independent  PATIENT GOALS: be able to walk without neck pain  NEXT MD VISIT: PRN  OBJECTIVE:  Note: Objective measures were completed at Evaluation unless otherwise noted.  DIAGNOSTIC FINDINGS:  Cervical x ray: Moderate multilevel degenerative disc disease. No acute abnormality seen.  PATIENT SURVEYS:  FOTO 65  POSTURE: rounded shoulders and forward head Neck in Rt cervical sidebending at rest  PALPATION: Hypomobile C3-C7 with CPAs and lateral glides Slight mm spasticity upper traps bilat   CERVICAL ROM:   Active ROM A/PROM (deg) eval AROM 12/23  Flexion 50   Extension 25 35  Right lateral flexion 25 35  Left lateral flexion 18 27  Right rotation 33   Left rotation 38    (Blank rows = not tested)  UPPER EXTREMITY MMT:  MMT Right eval Left eval  Shoulder flexion 4+ 4+  Shoulder extension    Shoulder abduction 4+ 4+  Shoulder adduction    Shoulder extension    Shoulder internal rotation    Shoulder external rotation    Middle trapezius    Lower trapezius    Elbow flexion    Elbow extension    Wrist flexion    Wrist extension    Wrist ulnar deviation    Wrist radial deviation    Wrist pronation    Wrist supination    Grip strength     (Blank rows  = not tested)   TODAY'S TREATMENT:                                                                                                                              [facility] Adult PT Treatment:                                                DATE: 08/09/2023 Therapeutic Exercise: UBE L4 x fwd/49min bkwd Standing rows + blue TB 2x15 Multifidi pulses + GTB x20 Shoulder ER + GTB x15 (B) Inclined plank: (low mat table) Static hold x30 Alternating single arm reach  Alternating opp shoulder taps Standing thread the needle with black foam roller (B) Cervical elongation --> standing with back against wall (folded towel placed behind head to decrease neck strain) Corner pec stretch 3x30 Standing angel arm stretch --> T arm stretch  Supine thoracic extension stretch over noodle --> added double arm raises & swimming Bird dog progression: legs only --> alt leg/opp arm   [facility] Adult PT Treatment:  DATE: 08/06/2023 Therapeutic Exercise: UBE L4 x fwd/71min bkwd Standing thread the needle with black foam roller 10x5 (B) Lower trap wall lift offs x10 Thoracic extension stretch at wall --> hands behind neck, elbows slide up/down wall 10x5 Quadruped: Alt single arm raises Unilateral hip extension kick out Bird dog Standing rows + blue TB 2x15 Multifidi pulses + GTB x20 Resisted walking fwd/bkwd 15# --> bkwdfwd 10# --> lateral 10#   [facility] Adult PT Treatment:                                                DATE: 08/02/2023 Therapeutic Exercise: UBE L4 x fwd/6min bkwd UT & LS stretch variations Self-massage with theracane Standing thread the needle with black foam roller 10x5 (B) Thoracic extension stretch at wall --> hands behind neck, elbows slide up/down wall 10x5 Double arm boxing + blue TB x15 Straight arm raises from 90 degrees x10 Rows + blue TB 2x15 Wall angel arm slides x 10 Lower trap wall lift offs x 15   PATIENT EDUCATION:   Education details: Updated HEP Person educated: Patient Education method: Explanation, Demonstration, and Handouts Education comprehension: verbalized understanding and returned demonstration  HOME EXERCISE PROGRAM: Access Code: [email or web address] URL: [email or web address] Date: 08/06/2023 Prepared by: Lamarr Price  Exercises - Shoulder W - External Rotation with Resistance  - 1 x daily - 4 x weekly - 3 sets - 10 reps - Shoulder External Rotation and Scapular Retraction with Resistance  - 1 x daily - 4 x weekly - 3 sets - 10 reps - Seated Cervical Retraction  - 1 x daily - 7 x weekly - 1 sets - 10 reps - 3 seconds hold - Shoulder Flexion Wall Slide with Resistance Band  - 1 x daily - 7 x weekly - 3 sets - 10 reps - Plank with Hands on Table  - 1 x daily - 7 x weekly - 1 sets - 3 reps - 30 seconds hold - Wall Clock with Theraband  - 1 x daily - 7 x weekly - 3 sets - 10 reps - Scaption with Dumbbells  - 1 x daily - 7 x weekly - 3 sets - 10 reps - Upper Trapezius Stretch  - 1 x daily - 7 x weekly - 2 sets - 3-5 reps - 10 -30 sec hold - Seated Levator Scapulae Stretch  - 1 x daily - 7 x weekly - 2 sets - 3-5 reps - 10-30 ec hold - Standing Thoracic Extension at Wall  - 1 x daily - 7 x weekly - 3 sets - 10 reps - 5 sec hold - Corner Pec Major Stretch  - 1 x daily - 7 x weekly - 3 sets - 3-5 reps - 10-30 sec hold - Bird Dog  - 1 x daily - 7 x weekly - 3 sets - 10 reps  ASSESSMENT:  CLINICAL IMPRESSION:  Shoulder strengthening progressed with added posterior shoulder stabilization exercises. Occasional cues provided to address forward head posture; overall patient demonstrates improved upright posture with some forward head posture. Patient continues to have intermittent pain on long walks, states he has not had pain during walks for a week but when pain does come it is either a 0 or a 7/10.   OBJECTIVE IMPAIRMENTS: decreased activity tolerance, decreased ROM, decreased strength, and  pain.    GOALS:  Goals reviewed with patient? Yes  SHORT TERM GOALS: Target date: 08/08/2023  Pt will be independent in initial HEP Baseline:  Goal status: MET  2.  Pt will improve cervical rotation by 10 degrees each direction Baseline:  Goal status: MET   LONG TERM GOALS: Target date: 09/05/2023  Pt will be independent in advanced HEP Baseline:  Goal status: INITIAL  2.  Pt will improve FOTO to >= 72 to demo improved functional mobility Baseline: 63 Goal status: IN PROGRESS  3.  Pt will tolerate walking 1 mile with pain <= 2/10 Baseline: 7/10 Goal status: IN PROGRESS   PLAN:  PT FREQUENCY: 1-2x/week  PT DURATION: 8 weeks  PLANNED INTERVENTIONS: 97164- PT Re-evaluation, 97110-Therapeutic exercises, 97530- Therapeutic activity, [id number]- Neuromuscular re-education, 97535- Self Care, 02859- Manual therapy, [id number]- Aquatic Therapy, 97014- Electrical stimulation (unattended), [id number]- Traction (mechanical), [id number]- Ionotophoresis 4mg /ml Dexamethasone, Patient/Family education, Taping, Dry Needling, Cryotherapy, and Moist heat  PLAN FOR NEXT SESSION: Progress postural endurance, postural strength, cervical mobility. Core strengthening, lifting/bending mechanics.   Lamarr GORMAN Price, PTA 08/09/2023, 10:57 AM

## 2023-08-20 DIAGNOSIS — R972 Elevated prostate specific antigen [PSA]: Secondary | ICD-10-CM | POA: Diagnosis not present

## 2023-08-20 DIAGNOSIS — R351 Nocturia: Secondary | ICD-10-CM | POA: Diagnosis not present

## 2023-08-21 ENCOUNTER — Encounter: Payer: Self-pay | Admitting: Physical Therapy

## 2023-08-21 ENCOUNTER — Ambulatory Visit: Payer: Medicare HMO | Admitting: Physical Therapy

## 2023-08-21 DIAGNOSIS — R293 Abnormal posture: Secondary | ICD-10-CM

## 2023-08-21 DIAGNOSIS — M542 Cervicalgia: Secondary | ICD-10-CM | POA: Diagnosis not present

## 2023-08-21 NOTE — Therapy (Signed)
  OUTPATIENT PHYSICAL THERAPY CERVICAL TREATMENT AND 10th VISIT NOTE   Patient Name: Derek Kent MRN: [id number] DOB:01/13/1956, 68 y.o., male Today's Date: 08/21/2023 Dates of service: 07/11/23-08/21/23 END OF SESSION:  PT End of Session - 01/14/

## 2023-08-24 DIAGNOSIS — J3081 Allergic rhinitis due to animal (cat) (dog) hair and dander: Secondary | ICD-10-CM | POA: Diagnosis not present

## 2023-08-24 DIAGNOSIS — J3089 Other allergic rhinitis: Secondary | ICD-10-CM | POA: Diagnosis not present

## 2023-08-24 DIAGNOSIS — J301 Allergic rhinitis due to pollen: Secondary | ICD-10-CM | POA: Diagnosis not present

## 2023-08-31 DIAGNOSIS — J301 Allergic rhinitis due to pollen: Secondary | ICD-10-CM | POA: Diagnosis not present

## 2023-08-31 DIAGNOSIS — J3089 Other allergic rhinitis: Secondary | ICD-10-CM | POA: Diagnosis not present

## 2023-08-31 DIAGNOSIS — J3081 Allergic rhinitis due to animal (cat) (dog) hair and dander: Secondary | ICD-10-CM | POA: Diagnosis not present

## 2023-09-07 DIAGNOSIS — J3081 Allergic rhinitis due to animal (cat) (dog) hair and dander: Secondary | ICD-10-CM | POA: Diagnosis not present

## 2023-09-07 DIAGNOSIS — J3089 Other allergic rhinitis: Secondary | ICD-10-CM | POA: Diagnosis not present

## 2023-09-07 DIAGNOSIS — J301 Allergic rhinitis due to pollen: Secondary | ICD-10-CM | POA: Diagnosis not present

## 2023-09-14 DIAGNOSIS — J3089 Other allergic rhinitis: Secondary | ICD-10-CM | POA: Diagnosis not present

## 2023-09-14 DIAGNOSIS — J3081 Allergic rhinitis due to animal (cat) (dog) hair and dander: Secondary | ICD-10-CM | POA: Diagnosis not present

## 2023-09-14 DIAGNOSIS — J301 Allergic rhinitis due to pollen: Secondary | ICD-10-CM | POA: Diagnosis not present

## 2023-09-21 DIAGNOSIS — J3081 Allergic rhinitis due to animal (cat) (dog) hair and dander: Secondary | ICD-10-CM | POA: Diagnosis not present

## 2023-09-21 DIAGNOSIS — J301 Allergic rhinitis due to pollen: Secondary | ICD-10-CM | POA: Diagnosis not present

## 2023-09-21 DIAGNOSIS — J3089 Other allergic rhinitis: Secondary | ICD-10-CM | POA: Diagnosis not present

## 2023-09-24 ENCOUNTER — Ambulatory Visit (INDEPENDENT_AMBULATORY_CARE_PROVIDER_SITE_OTHER): Payer: Medicare HMO | Admitting: Family Medicine

## 2023-09-24 ENCOUNTER — Encounter: Payer: Self-pay | Admitting: Family Medicine

## 2023-09-24 VITALS — BP 136/70 | HR 94 | Temp 98.4°F | Ht 69.0 in | Wt 189.0 lb

## 2023-09-24 DIAGNOSIS — Q383 Other congenital malformations of tongue: Secondary | ICD-10-CM | POA: Insufficient documentation

## 2023-09-24 NOTE — Assessment & Plan Note (Signed)
 Area of concern present since October. Keeps biting area. Currently with superficial laceration and raised palpable area. No node enlargement. Will send referral to oral surgeon for evaluation.

## 2023-09-24 NOTE — Progress Notes (Signed)
   Established Patient Office Visit  Subjective   Patient ID: Derek Kent, male    DOB: [birth year]  Age: 68 y.o. MRN: [id number]  Chief Complaint  Patient presents with   knott on tongue    Bit tongue in October 2024.lump size of dime- dentist appt in November but not really checked. Became flatter over time  but size remained the same.  Occasionally will bite area  by accident and bit very hard on area last night.     HPI  Bit tongue in October 2024, doesn't know which came first the bite or the lump on tongue. Bit again last night, "sliced"  the tongue" with some bleeding.  No node swelling. No other abnormality of tongue. Dentist "sorta" looked at it. Area before today, flat but able to feel it. Today, area is raised.   Review of Systems  Constitutional:  Negative for chills and fever.      Objective:     BP 136/70   Pulse 94   Temp 98.4 F (36.9 C)   Ht 5\' 9"  (1.753 m)   Wt 189 lb (85.7 kg)   SpO2 96%   BMI 27.91 kg/m  BP Readings from Last 3 Encounters:  09/24/23 136/70  07/03/23 123/65  06/25/23 132/69      Physical Exam Vitals and nursing note reviewed.  Constitutional:      General: He is not in acute distress.    Appearance: Normal appearance.  HENT:     Mouth/Throat:     Comments: Tongue with 3 cm superficial laceration. Area raised and palpable to touch.  Pulmonary:     Effort: Pulmonary effort is normal.  Skin:    General: Skin is warm and dry.  Neurological:     General: No focal deficit present.     Mental Status: He is alert. Mental status is at baseline.  Psychiatric:        Mood and Affect: Mood normal.        Behavior: Behavior normal.        Thought Content: Thought content normal.        Judgment: Judgment normal.     No results found for any visits on 09/24/23.    The 10-year ASCVD risk score (Arnett DK, et al., 2019) is: 17.8%    Assessment & Plan:   Problem List Items Addressed This Visit     Tongue abnormality  - Primary   Area of concern present since October. Keeps biting area. Currently with superficial laceration and raised palpable area. No node enlargement. Will send referral to oral surgeon for evaluation.       Relevant Orders   Ambulatory referral to Oral Maxillofacial Surgery  Agrees with plan of care discussed.  Questions answered.   Return if symptoms worsen or fail to improve.    Novella Olive, FNP

## 2023-09-27 DIAGNOSIS — H35371 Puckering of macula, right eye: Secondary | ICD-10-CM | POA: Diagnosis not present

## 2023-09-27 DIAGNOSIS — H524 Presbyopia: Secondary | ICD-10-CM | POA: Diagnosis not present

## 2023-09-27 DIAGNOSIS — D23111 Other benign neoplasm of skin of right upper eyelid, including canthus: Secondary | ICD-10-CM | POA: Diagnosis not present

## 2023-09-27 DIAGNOSIS — H43812 Vitreous degeneration, left eye: Secondary | ICD-10-CM | POA: Diagnosis not present

## 2023-09-27 DIAGNOSIS — H2513 Age-related nuclear cataract, bilateral: Secondary | ICD-10-CM | POA: Diagnosis not present

## 2023-09-27 DIAGNOSIS — H5203 Hypermetropia, bilateral: Secondary | ICD-10-CM | POA: Diagnosis not present

## 2023-09-28 DIAGNOSIS — J3081 Allergic rhinitis due to animal (cat) (dog) hair and dander: Secondary | ICD-10-CM | POA: Diagnosis not present

## 2023-09-28 DIAGNOSIS — J3089 Other allergic rhinitis: Secondary | ICD-10-CM | POA: Diagnosis not present

## 2023-09-28 DIAGNOSIS — J301 Allergic rhinitis due to pollen: Secondary | ICD-10-CM | POA: Diagnosis not present

## 2023-10-05 DIAGNOSIS — J301 Allergic rhinitis due to pollen: Secondary | ICD-10-CM | POA: Diagnosis not present

## 2023-10-05 DIAGNOSIS — J3089 Other allergic rhinitis: Secondary | ICD-10-CM | POA: Diagnosis not present

## 2023-10-05 DIAGNOSIS — J3081 Allergic rhinitis due to animal (cat) (dog) hair and dander: Secondary | ICD-10-CM | POA: Diagnosis not present

## 2023-10-06 ENCOUNTER — Other Ambulatory Visit: Payer: Self-pay | Admitting: Family Medicine

## 2023-10-06 DIAGNOSIS — M1A9XX0 Chronic gout, unspecified, without tophus (tophi): Secondary | ICD-10-CM

## 2023-10-10 DIAGNOSIS — G4733 Obstructive sleep apnea (adult) (pediatric): Secondary | ICD-10-CM | POA: Diagnosis not present

## 2023-10-11 ENCOUNTER — Other Ambulatory Visit: Payer: Self-pay | Admitting: Family Medicine

## 2023-10-11 DIAGNOSIS — I10 Essential (primary) hypertension: Secondary | ICD-10-CM

## 2023-10-12 DIAGNOSIS — J301 Allergic rhinitis due to pollen: Secondary | ICD-10-CM | POA: Diagnosis not present

## 2023-10-12 DIAGNOSIS — J3089 Other allergic rhinitis: Secondary | ICD-10-CM | POA: Diagnosis not present

## 2023-10-12 DIAGNOSIS — J3081 Allergic rhinitis due to animal (cat) (dog) hair and dander: Secondary | ICD-10-CM | POA: Diagnosis not present

## 2023-10-19 DIAGNOSIS — J301 Allergic rhinitis due to pollen: Secondary | ICD-10-CM | POA: Diagnosis not present

## 2023-10-19 DIAGNOSIS — J3081 Allergic rhinitis due to animal (cat) (dog) hair and dander: Secondary | ICD-10-CM | POA: Diagnosis not present

## 2023-10-19 DIAGNOSIS — J3089 Other allergic rhinitis: Secondary | ICD-10-CM | POA: Diagnosis not present

## 2023-10-23 ENCOUNTER — Encounter: Payer: Medicare HMO | Admitting: Physical Therapy

## 2023-10-26 DIAGNOSIS — J3089 Other allergic rhinitis: Secondary | ICD-10-CM | POA: Diagnosis not present

## 2023-10-26 DIAGNOSIS — J3081 Allergic rhinitis due to animal (cat) (dog) hair and dander: Secondary | ICD-10-CM | POA: Diagnosis not present

## 2023-10-26 DIAGNOSIS — J301 Allergic rhinitis due to pollen: Secondary | ICD-10-CM | POA: Diagnosis not present

## 2023-11-02 DIAGNOSIS — J301 Allergic rhinitis due to pollen: Secondary | ICD-10-CM | POA: Diagnosis not present

## 2023-11-02 DIAGNOSIS — J3081 Allergic rhinitis due to animal (cat) (dog) hair and dander: Secondary | ICD-10-CM | POA: Diagnosis not present

## 2023-11-02 DIAGNOSIS — J3089 Other allergic rhinitis: Secondary | ICD-10-CM | POA: Diagnosis not present

## 2023-11-03 ENCOUNTER — Other Ambulatory Visit: Payer: Self-pay | Admitting: Family Medicine

## 2023-11-03 DIAGNOSIS — E785 Hyperlipidemia, unspecified: Secondary | ICD-10-CM

## 2023-11-09 DIAGNOSIS — J3089 Other allergic rhinitis: Secondary | ICD-10-CM | POA: Diagnosis not present

## 2023-11-09 DIAGNOSIS — J301 Allergic rhinitis due to pollen: Secondary | ICD-10-CM | POA: Diagnosis not present

## 2023-11-09 DIAGNOSIS — J3081 Allergic rhinitis due to animal (cat) (dog) hair and dander: Secondary | ICD-10-CM | POA: Diagnosis not present

## 2023-11-16 DIAGNOSIS — J3089 Other allergic rhinitis: Secondary | ICD-10-CM | POA: Diagnosis not present

## 2023-11-16 DIAGNOSIS — J3081 Allergic rhinitis due to animal (cat) (dog) hair and dander: Secondary | ICD-10-CM | POA: Diagnosis not present

## 2023-11-16 DIAGNOSIS — J301 Allergic rhinitis due to pollen: Secondary | ICD-10-CM | POA: Diagnosis not present

## 2023-11-20 DIAGNOSIS — G4733 Obstructive sleep apnea (adult) (pediatric): Secondary | ICD-10-CM | POA: Diagnosis not present

## 2023-11-30 DIAGNOSIS — J301 Allergic rhinitis due to pollen: Secondary | ICD-10-CM | POA: Diagnosis not present

## 2023-11-30 DIAGNOSIS — J3081 Allergic rhinitis due to animal (cat) (dog) hair and dander: Secondary | ICD-10-CM | POA: Diagnosis not present

## 2023-11-30 DIAGNOSIS — J3089 Other allergic rhinitis: Secondary | ICD-10-CM | POA: Diagnosis not present

## 2023-12-19 DIAGNOSIS — I10 Essential (primary) hypertension: Secondary | ICD-10-CM | POA: Diagnosis not present

## 2023-12-21 DIAGNOSIS — J301 Allergic rhinitis due to pollen: Secondary | ICD-10-CM | POA: Diagnosis not present

## 2023-12-21 DIAGNOSIS — J3081 Allergic rhinitis due to animal (cat) (dog) hair and dander: Secondary | ICD-10-CM | POA: Diagnosis not present

## 2023-12-21 DIAGNOSIS — J3089 Other allergic rhinitis: Secondary | ICD-10-CM | POA: Diagnosis not present

## 2023-12-24 ENCOUNTER — Ambulatory Visit (INDEPENDENT_AMBULATORY_CARE_PROVIDER_SITE_OTHER): Payer: Medicare HMO | Admitting: Family Medicine

## 2023-12-24 ENCOUNTER — Encounter: Payer: Self-pay | Admitting: Family Medicine

## 2023-12-24 VITALS — BP 128/74 | HR 73 | Temp 97.6°F | Resp 18 | Ht 69.0 in | Wt 182.5 lb

## 2023-12-24 DIAGNOSIS — I10 Essential (primary) hypertension: Secondary | ICD-10-CM | POA: Diagnosis not present

## 2023-12-24 DIAGNOSIS — M5030 Other cervical disc degeneration, unspecified cervical region: Secondary | ICD-10-CM | POA: Diagnosis not present

## 2023-12-24 NOTE — Progress Notes (Signed)
   Established Patient Office Visit  Subjective   Patient ID: Derek Kent, male    DOB: Jan 15, 1956  Age: 68 y.o. MRN: [id number]  Chief Complaint  Patient presents with   Follow-up    Patient is here for a 6 month follow up    HPI  6 month follow up Pt had complaints of neck pain. Sent for xrays and showed DDD of cervical spine. He was sent to NSG and was referred to PT. He has completed this and still has some pain.   He is stable on his medicines.  He has HTN, blood pressure controlled. On Lisinopril  10mg  daily.   Review of Systems  Musculoskeletal:  Positive for neck pain.  All other systems reviewed and are negative.    Objective:     BP 128/74   Pulse 73   Temp 97.6 F (36.4 C) (Oral)   Resp 18   Ht 5\' 9"  (1.753 m)   Wt 182 lb 8 oz (82.8 kg)   SpO2 96%   BMI 26.95 kg/m  BP Readings from Last 3 Encounters:  12/24/23 128/74  09/24/23 136/70  07/03/23 123/65      Physical Exam Vitals and nursing note reviewed.  Constitutional:      Appearance: Normal appearance. He is normal weight.  HENT:     Head: Normocephalic and atraumatic.     Right Ear: External ear normal.     Left Ear: External ear normal.     Nose: Nose normal.     Mouth/Throat:     Mouth: Mucous membranes are moist.     Pharynx: Oropharynx is clear.  Eyes:     Conjunctiva/sclera: Conjunctivae normal.     Pupils: Pupils are equal, round, and reactive to light.  Cardiovascular:     Rate and Rhythm: Normal rate and regular rhythm.     Pulses: Normal pulses.     Heart sounds: Normal heart sounds.  Pulmonary:     Effort: Pulmonary effort is normal.     Breath sounds: Normal breath sounds.  Musculoskeletal:     Cervical back: Normal range of motion.  Skin:    General: Skin is warm.     Capillary Refill: Capillary refill takes less than 2 seconds.  Neurological:     General: No focal deficit present.     Mental Status: He is alert and oriented to person, place, and time. Mental status  is at baseline.  Psychiatric:        Mood and Affect: Mood normal.        Behavior: Behavior normal.        Thought Content: Thought content normal.        Judgment: Judgment normal.    No results found for any visits on 12/24/23.     The 10-year ASCVD risk score (Arnett DK, et al., 2019) is: 16.1%    Assessment & Plan:   Problem List Items Addressed This Visit   None Benign essential HTN  DDD (degenerative disc disease), cervical   Pt with HTN, BP stable and controlled. Cervical DDD stable. Continue follow up with NSG.   No follow-ups on file.    Manette Section, MD

## 2023-12-28 DIAGNOSIS — J3081 Allergic rhinitis due to animal (cat) (dog) hair and dander: Secondary | ICD-10-CM | POA: Diagnosis not present

## 2023-12-28 DIAGNOSIS — J3089 Other allergic rhinitis: Secondary | ICD-10-CM | POA: Diagnosis not present

## 2023-12-28 DIAGNOSIS — J301 Allergic rhinitis due to pollen: Secondary | ICD-10-CM | POA: Diagnosis not present

## 2024-01-04 DIAGNOSIS — J3081 Allergic rhinitis due to animal (cat) (dog) hair and dander: Secondary | ICD-10-CM | POA: Diagnosis not present

## 2024-01-04 DIAGNOSIS — J3089 Other allergic rhinitis: Secondary | ICD-10-CM | POA: Diagnosis not present

## 2024-01-04 DIAGNOSIS — J301 Allergic rhinitis due to pollen: Secondary | ICD-10-CM | POA: Diagnosis not present

## 2024-01-11 DIAGNOSIS — J3089 Other allergic rhinitis: Secondary | ICD-10-CM | POA: Diagnosis not present

## 2024-01-11 DIAGNOSIS — J3081 Allergic rhinitis due to animal (cat) (dog) hair and dander: Secondary | ICD-10-CM | POA: Diagnosis not present

## 2024-01-11 DIAGNOSIS — J301 Allergic rhinitis due to pollen: Secondary | ICD-10-CM | POA: Diagnosis not present

## 2024-01-25 DIAGNOSIS — J3081 Allergic rhinitis due to animal (cat) (dog) hair and dander: Secondary | ICD-10-CM | POA: Diagnosis not present

## 2024-01-25 DIAGNOSIS — J301 Allergic rhinitis due to pollen: Secondary | ICD-10-CM | POA: Diagnosis not present

## 2024-01-25 DIAGNOSIS — J3089 Other allergic rhinitis: Secondary | ICD-10-CM | POA: Diagnosis not present

## 2024-01-31 DIAGNOSIS — J301 Allergic rhinitis due to pollen: Secondary | ICD-10-CM | POA: Diagnosis not present

## 2024-02-01 DIAGNOSIS — J3081 Allergic rhinitis due to animal (cat) (dog) hair and dander: Secondary | ICD-10-CM | POA: Diagnosis not present

## 2024-02-01 DIAGNOSIS — J3089 Other allergic rhinitis: Secondary | ICD-10-CM | POA: Diagnosis not present

## 2024-02-01 DIAGNOSIS — J301 Allergic rhinitis due to pollen: Secondary | ICD-10-CM | POA: Diagnosis not present

## 2024-02-07 DIAGNOSIS — J3081 Allergic rhinitis due to animal (cat) (dog) hair and dander: Secondary | ICD-10-CM | POA: Diagnosis not present

## 2024-02-07 DIAGNOSIS — J3089 Other allergic rhinitis: Secondary | ICD-10-CM | POA: Diagnosis not present

## 2024-02-07 DIAGNOSIS — J301 Allergic rhinitis due to pollen: Secondary | ICD-10-CM | POA: Diagnosis not present

## 2024-02-13 DIAGNOSIS — L918 Other hypertrophic disorders of the skin: Secondary | ICD-10-CM | POA: Diagnosis not present

## 2024-02-13 DIAGNOSIS — D2261 Melanocytic nevi of right upper limb, including shoulder: Secondary | ICD-10-CM | POA: Diagnosis not present

## 2024-02-13 DIAGNOSIS — D2272 Melanocytic nevi of left lower limb, including hip: Secondary | ICD-10-CM | POA: Diagnosis not present

## 2024-02-13 DIAGNOSIS — L819 Disorder of pigmentation, unspecified: Secondary | ICD-10-CM | POA: Diagnosis not present

## 2024-02-13 DIAGNOSIS — D224 Melanocytic nevi of scalp and neck: Secondary | ICD-10-CM | POA: Diagnosis not present

## 2024-02-13 DIAGNOSIS — L821 Other seborrheic keratosis: Secondary | ICD-10-CM | POA: Diagnosis not present

## 2024-02-13 DIAGNOSIS — D2239 Melanocytic nevi of other parts of face: Secondary | ICD-10-CM | POA: Diagnosis not present

## 2024-02-13 DIAGNOSIS — L720 Epidermal cyst: Secondary | ICD-10-CM | POA: Diagnosis not present

## 2024-02-13 DIAGNOSIS — D1801 Hemangioma of skin and subcutaneous tissue: Secondary | ICD-10-CM | POA: Diagnosis not present

## 2024-02-15 DIAGNOSIS — J301 Allergic rhinitis due to pollen: Secondary | ICD-10-CM | POA: Diagnosis not present

## 2024-02-15 DIAGNOSIS — J3081 Allergic rhinitis due to animal (cat) (dog) hair and dander: Secondary | ICD-10-CM | POA: Diagnosis not present

## 2024-02-15 DIAGNOSIS — J3089 Other allergic rhinitis: Secondary | ICD-10-CM | POA: Diagnosis not present

## 2024-02-22 DIAGNOSIS — J3089 Other allergic rhinitis: Secondary | ICD-10-CM | POA: Diagnosis not present

## 2024-02-22 DIAGNOSIS — J301 Allergic rhinitis due to pollen: Secondary | ICD-10-CM | POA: Diagnosis not present

## 2024-02-22 DIAGNOSIS — J3081 Allergic rhinitis due to animal (cat) (dog) hair and dander: Secondary | ICD-10-CM | POA: Diagnosis not present

## 2024-02-25 ENCOUNTER — Other Ambulatory Visit: Payer: Self-pay | Admitting: Nurse Practitioner

## 2024-02-25 ENCOUNTER — Encounter: Payer: Self-pay | Admitting: Nurse Practitioner

## 2024-02-25 DIAGNOSIS — R972 Elevated prostate specific antigen [PSA]: Secondary | ICD-10-CM

## 2024-02-29 DIAGNOSIS — J3081 Allergic rhinitis due to animal (cat) (dog) hair and dander: Secondary | ICD-10-CM | POA: Diagnosis not present

## 2024-02-29 DIAGNOSIS — J301 Allergic rhinitis due to pollen: Secondary | ICD-10-CM | POA: Diagnosis not present

## 2024-02-29 DIAGNOSIS — J3089 Other allergic rhinitis: Secondary | ICD-10-CM | POA: Diagnosis not present

## 2024-03-07 DIAGNOSIS — J301 Allergic rhinitis due to pollen: Secondary | ICD-10-CM | POA: Diagnosis not present

## 2024-03-07 DIAGNOSIS — J3089 Other allergic rhinitis: Secondary | ICD-10-CM | POA: Diagnosis not present

## 2024-03-07 DIAGNOSIS — J3081 Allergic rhinitis due to animal (cat) (dog) hair and dander: Secondary | ICD-10-CM | POA: Diagnosis not present

## 2024-03-14 DIAGNOSIS — J3081 Allergic rhinitis due to animal (cat) (dog) hair and dander: Secondary | ICD-10-CM | POA: Diagnosis not present

## 2024-03-14 DIAGNOSIS — J301 Allergic rhinitis due to pollen: Secondary | ICD-10-CM | POA: Diagnosis not present

## 2024-03-14 DIAGNOSIS — J3089 Other allergic rhinitis: Secondary | ICD-10-CM | POA: Diagnosis not present

## 2024-03-27 ENCOUNTER — Other Ambulatory Visit: Payer: Self-pay | Admitting: Family Medicine

## 2024-03-27 DIAGNOSIS — M1A9XX0 Chronic gout, unspecified, without tophus (tophi): Secondary | ICD-10-CM

## 2024-03-28 ENCOUNTER — Ambulatory Visit
Admission: RE | Admit: 2024-03-28 | Discharge: 2024-03-28 | Disposition: A | Discharge status: Home / Self Care | Source: Ambulatory Visit | Attending: Nurse Practitioner | Admitting: Nurse Practitioner

## 2024-03-28 DIAGNOSIS — R972 Elevated prostate specific antigen [PSA]: Secondary | ICD-10-CM

## 2024-03-28 NOTE — Progress Notes (Signed)
 Pt transferred to wheelchair, 2 staff members assisted pt, tolerated well, moved to nursing area and placed on stretcher, lying flat, continue with VS, see flowsheets, Dr. Derrill to bedside, more PO fluids given, safety maintained

## 2024-03-28 NOTE — Progress Notes (Addendum)
 Dr. Derrill to bedside, ok to dc pt home if feeling ok and VSS, after sitting and walking around.  See flowsheets for VS, pt denies dizziness and states he is feeling ok with walking and sitting  Pt left DRI 315 at approximately [phone]

## 2024-03-28 NOTE — Progress Notes (Signed)
 This RN called to MRI regarding pt not feeling well/passing out.  Pt sitting in chair in lab room/iv room, pt states he doesn't feel well, vs machine obtained, see flowsheets for VS, pt denies being diabetic, states he hasn't eaten since about 1700 yesterday, crackers and ginger-ale provided by MRI staff.  Dr Derrill notified of situation by this RN

## 2024-04-04 DIAGNOSIS — J3081 Allergic rhinitis due to animal (cat) (dog) hair and dander: Secondary | ICD-10-CM | POA: Diagnosis not present

## 2024-04-04 DIAGNOSIS — J301 Allergic rhinitis due to pollen: Secondary | ICD-10-CM | POA: Diagnosis not present

## 2024-04-04 DIAGNOSIS — J3089 Other allergic rhinitis: Secondary | ICD-10-CM | POA: Diagnosis not present

## 2024-04-07 ENCOUNTER — Other Ambulatory Visit: Payer: Self-pay | Admitting: Family Medicine

## 2024-04-07 DIAGNOSIS — I10 Essential (primary) hypertension: Secondary | ICD-10-CM

## 2024-04-07 DIAGNOSIS — N1831 Chronic kidney disease, stage 3a: Secondary | ICD-10-CM

## 2024-04-18 DIAGNOSIS — J3089 Other allergic rhinitis: Secondary | ICD-10-CM | POA: Diagnosis not present

## 2024-04-18 DIAGNOSIS — J301 Allergic rhinitis due to pollen: Secondary | ICD-10-CM | POA: Diagnosis not present

## 2024-04-18 DIAGNOSIS — J3081 Allergic rhinitis due to animal (cat) (dog) hair and dander: Secondary | ICD-10-CM | POA: Diagnosis not present

## 2024-04-21 DIAGNOSIS — G4733 Obstructive sleep apnea (adult) (pediatric): Secondary | ICD-10-CM | POA: Diagnosis not present

## 2024-04-23 DIAGNOSIS — J3089 Other allergic rhinitis: Secondary | ICD-10-CM | POA: Diagnosis not present

## 2024-04-23 DIAGNOSIS — J3081 Allergic rhinitis due to animal (cat) (dog) hair and dander: Secondary | ICD-10-CM | POA: Diagnosis not present

## 2024-04-23 DIAGNOSIS — J301 Allergic rhinitis due to pollen: Secondary | ICD-10-CM | POA: Diagnosis not present

## 2024-04-24 ENCOUNTER — Ambulatory Visit
Admission: RE | Admit: 2024-04-24 | Discharge: 2024-04-24 | Disposition: A | Discharge status: Home / Self Care | Source: Ambulatory Visit | Attending: Nurse Practitioner | Admitting: Nurse Practitioner

## 2024-04-24 DIAGNOSIS — R972 Elevated prostate specific antigen [PSA]: Secondary | ICD-10-CM | POA: Diagnosis not present

## 2024-04-24 MED ORDER — GADOPICLENOL 0.5 MMOL/ML IV SOLN
8.0000 mL | Freq: Once | INTRAVENOUS | Status: AC | PRN
  Administered 2024-04-24: 8 mL via INTRAVENOUS

## 2024-04-25 DIAGNOSIS — J301 Allergic rhinitis due to pollen: Secondary | ICD-10-CM | POA: Diagnosis not present

## 2024-04-25 DIAGNOSIS — J3089 Other allergic rhinitis: Secondary | ICD-10-CM | POA: Diagnosis not present

## 2024-04-25 DIAGNOSIS — J3081 Allergic rhinitis due to animal (cat) (dog) hair and dander: Secondary | ICD-10-CM | POA: Diagnosis not present

## 2024-05-02 DIAGNOSIS — J3081 Allergic rhinitis due to animal (cat) (dog) hair and dander: Secondary | ICD-10-CM | POA: Diagnosis not present

## 2024-05-02 DIAGNOSIS — J301 Allergic rhinitis due to pollen: Secondary | ICD-10-CM | POA: Diagnosis not present

## 2024-05-02 DIAGNOSIS — J3089 Other allergic rhinitis: Secondary | ICD-10-CM | POA: Diagnosis not present

## 2024-05-07 DIAGNOSIS — J3081 Allergic rhinitis due to animal (cat) (dog) hair and dander: Secondary | ICD-10-CM | POA: Diagnosis not present

## 2024-05-07 DIAGNOSIS — J3089 Other allergic rhinitis: Secondary | ICD-10-CM | POA: Diagnosis not present

## 2024-05-09 DIAGNOSIS — J3081 Allergic rhinitis due to animal (cat) (dog) hair and dander: Secondary | ICD-10-CM | POA: Diagnosis not present

## 2024-05-09 DIAGNOSIS — J301 Allergic rhinitis due to pollen: Secondary | ICD-10-CM | POA: Diagnosis not present

## 2024-05-09 DIAGNOSIS — J3089 Other allergic rhinitis: Secondary | ICD-10-CM | POA: Diagnosis not present

## 2024-05-16 DIAGNOSIS — J3081 Allergic rhinitis due to animal (cat) (dog) hair and dander: Secondary | ICD-10-CM | POA: Diagnosis not present

## 2024-05-16 DIAGNOSIS — J301 Allergic rhinitis due to pollen: Secondary | ICD-10-CM | POA: Diagnosis not present

## 2024-05-16 DIAGNOSIS — J3089 Other allergic rhinitis: Secondary | ICD-10-CM | POA: Diagnosis not present

## 2024-05-30 DIAGNOSIS — J301 Allergic rhinitis due to pollen: Secondary | ICD-10-CM | POA: Diagnosis not present

## 2024-05-30 DIAGNOSIS — J3089 Other allergic rhinitis: Secondary | ICD-10-CM | POA: Diagnosis not present

## 2024-05-30 DIAGNOSIS — J3081 Allergic rhinitis due to animal (cat) (dog) hair and dander: Secondary | ICD-10-CM | POA: Diagnosis not present

## 2024-06-06 DIAGNOSIS — J301 Allergic rhinitis due to pollen: Secondary | ICD-10-CM | POA: Diagnosis not present

## 2024-06-06 DIAGNOSIS — J3089 Other allergic rhinitis: Secondary | ICD-10-CM | POA: Diagnosis not present

## 2024-06-06 DIAGNOSIS — J3081 Allergic rhinitis due to animal (cat) (dog) hair and dander: Secondary | ICD-10-CM | POA: Diagnosis not present

## 2024-06-12 DIAGNOSIS — J301 Allergic rhinitis due to pollen: Secondary | ICD-10-CM | POA: Diagnosis not present

## 2024-06-12 DIAGNOSIS — J3089 Other allergic rhinitis: Secondary | ICD-10-CM | POA: Diagnosis not present

## 2024-06-12 DIAGNOSIS — J3081 Allergic rhinitis due to animal (cat) (dog) hair and dander: Secondary | ICD-10-CM | POA: Diagnosis not present

## 2024-06-19 ENCOUNTER — Telehealth: Payer: Self-pay | Admitting: Family Medicine

## 2024-06-19 NOTE — Telephone Encounter (Signed)
 Noted.  PCP is Dr. Colette.     Copied from CRM #[id number]. Topic: Appointments - Transfer of Care >> Jun 19, 2024 11:27 AM Treva T wrote: Pt is requesting to transfer FROM: Derek Brownie, NP Pt is requesting to transfer TO: Derek Gave, PA Reason for requested transfer: pt preference requested It is the responsibility of the team the patient would like to transfer to [facility] [place]) to reach out to the patient if for any reason this transfer is not acceptable.

## 2024-06-23 ENCOUNTER — Encounter: Payer: Medicare HMO | Admitting: Family Medicine

## 2024-06-23 ENCOUNTER — Encounter: Payer: Self-pay | Admitting: Family Medicine

## 2024-06-23 ENCOUNTER — Ambulatory Visit (INDEPENDENT_AMBULATORY_CARE_PROVIDER_SITE_OTHER): Admitting: Family Medicine

## 2024-06-23 VITALS — BP 122/62 | HR 76 | Ht 69.0 in | Wt 187.0 lb

## 2024-06-23 DIAGNOSIS — Z0000 Encounter for general adult medical examination without abnormal findings: Secondary | ICD-10-CM | POA: Insufficient documentation

## 2024-06-23 DIAGNOSIS — Z23 Encounter for immunization: Secondary | ICD-10-CM

## 2024-06-23 DIAGNOSIS — Z125 Encounter for screening for malignant neoplasm of prostate: Secondary | ICD-10-CM | POA: Insufficient documentation

## 2024-06-23 NOTE — Progress Notes (Signed)
 Chief Complaint  Patient presents with   Medicare Wellness    No concerns Pt filled out questionnaire through MyChart     Subjective:   Derek Kent is a 68 y.o. male who presents for a Medicare Annual Wellness Visit.  Allergies (verified) Tape and Ibuprofen   History: Past Medical History:  Diagnosis Date   Allergy    Seasonal allergies   Atypical chest pain    normal echo and nuclear stress test 2017   CKD (chronic kidney disease) stage 3, GFR 30-59 ml/min (HCC)    proteinuria   DDD (degenerative disc disease), cervical    Family history of premature coronary artery disease 11/02/2015   Fatty liver    GERD (gastroesophageal reflux disease)    Infrequent   Gout    Hiatal hernia    Hypertension    PVC's (premature ventricular contractions) 11/02/2015   Sleep apnea    Past Surgical History:  Procedure Laterality Date   NO PAST SURGERIES     Family History  Problem Relation Age of Onset   Breast cancer Mother    Heart disease Father    Heart attack Maternal Uncle    Heart attack Paternal Uncle    Heart attack Maternal Grandfather    Social History   Occupational History   Occupation: Retired  Tobacco Use   Smoking status: Never   Smokeless tobacco: Never  Vaping Use   Vaping status: Never Used  Substance and Sexual Activity   Alcohol use: No   Drug use: No   Sexual activity: Yes    Birth control/protection: None   Tobacco Counseling Counseling given: Not Answered  SDOH Screenings   Food Insecurity: No Food Insecurity (06/23/2024)  Housing: Unknown (06/23/2024)  Transportation Needs: No Transportation Needs (06/23/2024)  Utilities: Not At Risk (06/23/2024)  Depression (PHQ2-9): Low Risk  (06/23/2024)  Financial Resource Strain: Low Risk  (06/20/2024)  Physical Activity: Insufficiently Active (06/23/2024)  Social Connections: Unknown (06/23/2024)  Stress: No Stress Concern Present (06/23/2024)  Tobacco Use: Low Risk  (06/23/2024)  Health  Literacy: Adequate Health Literacy (06/23/2024)   See flowsheets for full screening details  Depression Screen PHQ 2 & 9 Depression Scale- Over the past 2 weeks, how often have you been bothered by any of the following problems? Little interest or pleasure in doing things: 0 Feeling down, depressed, or hopeless (PHQ Adolescent also includes...irritable): 0 PHQ-2 Total Score: 0 Trouble falling or staying asleep, or sleeping too much: 0 Feeling tired or having little energy: 0 Poor appetite or overeating (PHQ Adolescent also includes...weight loss): 0 Feeling bad about yourself - or that you are a failure or have let yourself or your family down: 0 Trouble concentrating on things, such as reading the newspaper or watching television (PHQ Adolescent also includes...like school work): 0 Moving or speaking so slowly that other people could have noticed. Or the opposite - being so fidgety or restless that you have been moving around a lot more than usual: 0 Thoughts that you would be better off dead, or of hurting yourself in some way: 0 PHQ-9 Total Score: 0 If you checked off any problems, how difficult have these problems made it for you to do your work, take care of things at home, or get along with other people?: Not difficult at all     Goals Addressed             This Visit's Progress    Set My Weight Loss Goal          -  set weight loss goal : would like lose 5 pounds    Notes: eating home cooked meals will help        Visit info / Clinical Intake: Medicare Wellness Visit Type:: Subsequent Annual Wellness Visit Persons participating in visit:: patient Medicare Wellness Visit Mode:: In-person (required for WTM) Information given by:: patient Interpreter Needed?: No Pre-visit prep was completed: no AWV questionnaire completed by patient prior to visit?: yes Living arrangements:: lives with spouse/significant other Patient's Overall Health Status Rating: excellent Typical  amount of pain: none Does pain affect daily life?: no Are you currently prescribed opioids?: no  Dietary Habits and Nutritional Risks How many meals a day?: 3 Eats fruit and vegetables daily?: yes Most meals are obtained by: preparing own meals; eating out In the last 2 weeks, have you had any of the following?: none Diabetic:: no  Functional Status Activities of Daily Living (to include ambulation/medication): Independent Ambulation: Independent Medication Administration: Independent Home Management: Independent Manage your own finances?: yes Primary transportation is: driving Concerns about vision?: no *vision screening is required for WTM* Concerns about hearing?: no  Fall Screening Falls in the past year?: 0 Number of falls in past year: 0 Was there an injury with Fall?: 0 Fall Risk Category Calculator: 0 Patient Fall Risk Level: Low Fall Risk  Fall Risk Patient at Risk for Falls Due to: No Fall Risks Fall risk Follow up: Falls evaluation completed  Home and Transportation Safety: All rugs have non-skid backing?: N/A, no rugs All stairs or steps have railings?: yes Grab bars in the bathtub or shower?: (!) no Have non-skid surface in bathtub or shower?: yes Good home lighting?: yes Regular seat belt use?: yes Hospital stays in the last year:: no  Cognitive Assessment Difficulty concentrating, remembering, or making decisions? : no Will 6CIT or Mini Cog be Completed: yes What year is it?: 0 points What month is it?: 0 points Give patient an address phrase to remember (5 components): [address] [place] About what time is it?: 0 points Count backwards from 20 to 1: 0 points Say the months of the year in reverse: 0 points Repeat the address phrase from earlier: 0 points 6 CIT Score: 0 points  Advance Directives (For Healthcare) Does Patient Have a Medical Advance Directive?: No Would patient like information on creating a medical advance directive?:  Yes (MAU/Ambulatory/Procedural Areas - Information given)  Reviewed/Updated  Reviewed/Updated: Reviewed All (Medical, Surgical, Family, Medications, Allergies, Care Teams, Patient Goals)        Objective:    Today's Vitals   06/23/24 1322  BP: 122/62  Pulse: 76  SpO2: 98%  Weight: 187 lb (84.8 kg)  Height: 5' 9 (1.753 m)   Body mass index is 27.62 kg/m.  Current Medications (verified) Outpatient Encounter Medications as of 06/23/2024  Medication Sig   allopurinol  (ZYLOPRIM ) 100 MG tablet TAKE 2 TABLETS BY MOUTH EVERY DAY   EPINEPHrine  0.3 mg/0.3 mL IJ SOAJ injection as directed Injection prn 30 days   fish oil-omega-3 fatty acids 1000 MG capsule Take 2 g by mouth daily.   fluticasone (FLONASE) 50 MCG/ACT nasal spray Place 1 spray into both nostrils daily. prn   lisinopril  (ZESTRIL ) 10 MG tablet TAKE 1 TABLET BY MOUTH EVERY DAY   Multiple Vitamins-Minerals (MULTIVITAMIN WITH MINERALS) tablet Take 1 tablet by mouth daily.   pravastatin  (PRAVACHOL ) 10 MG tablet TAKE 1 TABLET BY MOUTH EVERY DAY   tamsulosin  (FLOMAX ) 0.4 MG CAPS capsule 1 CAPSULE BY MOUTH ONCE  A DAY 90 DAYS   VITAMIN D, CHOLECALCIFEROL, PO Take by mouth.   [DISCONTINUED] Baclofen  5 MG TABS Take 1 tablet (5 mg total) by mouth 3 (three) times daily as needed.   [DISCONTINUED] ciclopirox  (LOPROX ) 0.77 % cream Apply topically 2 (two) times daily.   [DISCONTINUED] hydrOXYzine  (ATARAX ) 25 MG tablet Take 1 tablet (25 mg total) by mouth every 8 (eight) hours as needed for itching.   No facility-administered encounter medications on file as of 06/23/2024.   Hearing/Vision screen Hearing Screening - Comments:: Grossly intact, recent hearing test.  Vision Screening - Comments:: Wears glasses, no concerns  Immunizations and Health Maintenance Health Maintenance  Topic Date Due   Influenza Vaccine  03/07/2024   Medicare Annual Wellness (AWV)  06/23/2025   Colonoscopy  10/19/2026   DTaP/Tdap/Td (3 - Td or Tdap)  04/24/2028   Pneumococcal Vaccine: 50+ Years  Completed   Zoster Vaccines- Shingrix  Completed   Meningococcal B Vaccine  Aged Out   COVID-19 Vaccine  Discontinued   Hepatitis C Screening  Discontinued        Assessment/Plan:  This is a routine wellness examination for Derek Kent.  Patient Care Team: Colette Torrence GRADE, MD as PCP - General (Family Medicine)  I have personally reviewed and noted the following in the patient's chart:   Medical and social history Use of alcohol, tobacco or illicit drugs  Current medications and supplements including opioid prescriptions. Functional ability and status Nutritional status Physical activity Advanced directives List of other physicians Hospitalizations, surgeries, and ER visits in previous 12 months: none  Vitals Screenings to include cognitive, depression, and falls Referrals and appointments: no referrals placed today   Orders Placed This Encounter  Procedures   Flu vaccine HIGH DOSE PF(Fluzone Trivalent)   In addition, I have reviewed and discussed with patient certain preventive protocols, quality metrics, and best practice recommendations. A written personalized care plan for preventive services as well as general preventive health recommendations were provided to patient.   Darice JONELLE Brownie, FNP   06/23/2024   No follow-ups on file.  After Visit Summary: (In Person-Printed) AVS printed and given to the patient  Nurse Notes: Influenza today.

## 2024-06-25 ENCOUNTER — Encounter: Payer: Medicare HMO | Admitting: Family Medicine

## 2024-06-26 ENCOUNTER — Encounter: Payer: Self-pay | Admitting: Family Medicine

## 2024-06-26 ENCOUNTER — Ambulatory Visit (INDEPENDENT_AMBULATORY_CARE_PROVIDER_SITE_OTHER): Admitting: Family Medicine

## 2024-06-26 VITALS — BP 131/75 | HR 77 | Temp 97.5°F | Ht 69.0 in | Wt 184.1 lb

## 2024-06-26 DIAGNOSIS — Z136 Encounter for screening for cardiovascular disorders: Secondary | ICD-10-CM | POA: Diagnosis not present

## 2024-06-26 DIAGNOSIS — Z13220 Encounter for screening for lipoid disorders: Secondary | ICD-10-CM | POA: Diagnosis not present

## 2024-06-26 DIAGNOSIS — M1A9XX0 Chronic gout, unspecified, without tophus (tophi): Secondary | ICD-10-CM | POA: Insufficient documentation

## 2024-06-26 DIAGNOSIS — Z0000 Encounter for general adult medical examination without abnormal findings: Secondary | ICD-10-CM

## 2024-06-26 DIAGNOSIS — E785 Hyperlipidemia, unspecified: Secondary | ICD-10-CM | POA: Diagnosis not present

## 2024-06-26 DIAGNOSIS — Z130 Encounter for screening for diseases of the blood and blood-forming organs and certain disorders involving the immune mechanism: Secondary | ICD-10-CM

## 2024-06-26 DIAGNOSIS — Z125 Encounter for screening for malignant neoplasm of prostate: Secondary | ICD-10-CM | POA: Diagnosis not present

## 2024-06-26 DIAGNOSIS — Z13228 Encounter for screening for other metabolic disorders: Secondary | ICD-10-CM | POA: Diagnosis not present

## 2024-06-26 MED ORDER — PRAVASTATIN SODIUM 10 MG PO TABS: 10.0000 mg | ORAL_TABLET | Freq: Every day | ORAL | 1 refills | Status: AC

## 2024-06-26 NOTE — Progress Notes (Signed)
 Complete physical exam  Patient: Derek Kent   DOB: 1955-09-05   68 y.o. Male  MRN: [id number]  Subjective:    Chief Complaint  Patient presents with   Annual Exam    Derek Kent is a 68 y.o. male who presents today for a complete physical exam. He reports consuming a general diet. Walking twice per day for 1 mile each time.  He generally feels well. He reports sleeping fairly well. He does not have additional problems to discuss today.   Discussed calcium score and echo. Advised to discuss with PCP upon return from leave.  Agrees with this suggestion.   Most recent fall risk assessment:    06/26/2024    8:33 AM  Fall Risk   Falls in the past year? 0  Number falls in past yr: 0  Injury with Fall? 0  Risk for fall due to : No Fall Risks  Follow up Falls evaluation completed     Most recent depression screenings:    06/26/2024    8:33 AM 06/23/2024    1:46 PM  PHQ 2/9 Scores  PHQ - 2 Score 0 0  PHQ- 9 Score 0 0    Vision:Within last year and Dental: No current dental problems and Receives regular dental care    Patient Care Team: Colette Torrence GRADE, MD as PCP - General (Family Medicine)   Outpatient Medications Prior to Visit  Medication Sig   allopurinol  (ZYLOPRIM ) 100 MG tablet TAKE 2 TABLETS BY MOUTH EVERY DAY   EPINEPHrine  0.3 mg/0.3 mL IJ SOAJ injection as directed Injection prn 30 days   fish oil-omega-3 fatty acids 1000 MG capsule Take 2 g by mouth daily.   fluticasone (FLONASE) 50 MCG/ACT nasal spray Place 1 spray into both nostrils daily. prn   lisinopril  (ZESTRIL ) 10 MG tablet TAKE 1 TABLET BY MOUTH EVERY DAY   Multiple Vitamins-Minerals (MULTIVITAMIN WITH MINERALS) tablet Take 1 tablet by mouth daily.   tamsulosin  (FLOMAX ) 0.4 MG CAPS capsule 1 CAPSULE BY MOUTH ONCE A DAY 90 DAYS   VITAMIN D, CHOLECALCIFEROL, PO Take by mouth.   [DISCONTINUED] pravastatin  (PRAVACHOL ) 10 MG tablet TAKE 1 TABLET BY MOUTH EVERY DAY   No facility-administered  medications prior to visit.    ROS        Objective:     BP 131/75 (BP Location: Left Arm, Patient Position: Sitting, Cuff Size: Normal)   Pulse 77   Temp (!) 97.5 F (36.4 C) (Oral)   Ht 5' 9 (1.753 m)   Wt 184 lb 1.6 oz (83.5 kg)   SpO2 97%   BMI 27.19 kg/m    Physical Exam Vitals and nursing note reviewed.  Constitutional:      General: He is not in acute distress.    Appearance: Normal appearance.  HENT:     Right Ear: Tympanic membrane normal.     Left Ear: Tympanic membrane normal.     Nose: Nose normal.     Mouth/Throat:     Mouth: Mucous membranes are moist.     Pharynx: Oropharynx is clear.  Eyes:     Extraocular Movements: Extraocular movements intact.  Neck:     Thyroid : No thyroid  tenderness.  Cardiovascular:     Rate and Rhythm: Normal rate and regular rhythm.     Pulses:          Radial pulses are 2+ on the right side and 2+ on the left side.     Heart sounds:  Normal heart sounds, S1 normal and S2 normal.  Pulmonary:     Effort: Pulmonary effort is normal.     Breath sounds: Normal breath sounds.  Abdominal:     General: Bowel sounds are normal.     Palpations: Abdomen is soft.     Tenderness: There is no abdominal tenderness.  Musculoskeletal:        General: Normal range of motion.     Cervical back: Normal range of motion.     Right lower leg: No edema.     Left lower leg: No edema.  Lymphadenopathy:     Cervical:     Right cervical: No superficial cervical adenopathy.    Left cervical: No superficial cervical adenopathy.  Skin:    General: Skin is warm and dry.  Neurological:     General: No focal deficit present.     Mental Status: He is alert. Mental status is at baseline.  Psychiatric:        Mood and Affect: Mood normal.        Behavior: Behavior normal.        Thought Content: Thought content normal.        Judgment: Judgment normal.      No results found for any visits on 06/26/24.     Assessment & Plan:    Routine  Health Maintenance and Physical Exam  Immunization History  Administered Date(s) Administered   Fluad Trivalent(High Dose 65+) 06/14/2023   INFLUENZA, HIGH DOSE SEASONAL PF 06/23/2024   Influenza-Unspecified 06/05/2022   PFIZER(Purple Top)SARS-COV-2 Vaccination 10/23/2019, 03/06/2020, 03/19/2020, 06/25/2020, 03/11/2021   PNEUMOCOCCAL CONJUGATE-20 10/09/2022   Pfizer Covid-19 Vaccine Bivalent Booster 53yrs & up 08/19/2021   Td 04/24/2018   Tdap 09/29/2008   Zoster Recombinant(Shingrix) 07/07/2018, 10/25/2018    Health Maintenance  Topic Date Due   Medicare Annual Wellness (AWV)  06/23/2025   Colonoscopy  10/19/2026   DTaP/Tdap/Td (3 - Td or Tdap) 04/24/2028   Pneumococcal Vaccine: 50+ Years  Completed   Influenza Vaccine  Completed   Zoster Vaccines- Shingrix  Completed   Meningococcal B Vaccine  Aged Out   COVID-19 Vaccine  Discontinued   Hepatitis C Screening  Discontinued    Discussed health benefits of physical activity, and encouraged him to engage in regular exercise appropriate for his age and condition.  Annual physical exam Assessment & Plan: Sees urology. MRI previously done. Monitor PSA. Taking Flomax  0.4 mg daily. Reports night time urination 2-3 times.    Orders: -     CBC -     Comprehensive metabolic panel with GFR -     Hemoglobin A1c -     Lipid panel -     PSA -     TSH + free T4  Encounter for lipid screening for cardiovascular disease Assessment & Plan: Sees urology. MRI previously done. Monitor PSA. Taking Flomax  0.4 mg daily. Reports night time urination 2-3 times.    Orders: -     Lipid panel  Encounter for screening for metabolic disorder Assessment & Plan: Sees urology. MRI previously done. Monitor PSA. Taking Flomax  0.4 mg daily. Reports night time urination 2-3 times.    Orders: -     Comprehensive metabolic panel with GFR -     Hemoglobin A1c -     TSH + free T4  Screening for deficiency anemia Assessment &  Plan: Sees urology. MRI previously done. Monitor PSA. Taking Flomax  0.4 mg daily. Reports night time urination 2-3 times.  Orders: -     CBC  Screening PSA (prostate specific antigen) Assessment & Plan: Sees urology. MRI previously done. Monitor PSA. Taking Flomax  0.4 mg daily. Reports night time urination 2-3 times.    Orders: -     PSA  Chronic gout without tophus, unspecified cause, unspecified site Assessment & Plan: Uric acid monitoring. Allopurinol  100 mg BID. No reported flares.   Orders: -     Uric acid  Hyperlipidemia, unspecified hyperlipidemia type -     Pravastatin  Sodium; Take 1 tablet (10 mg total) by mouth daily.  Dispense: 90 tablet; Refill: 1      Routine labs ordered.  HCM reviewed/discussed. Anticipatory guidance regarding healthy weight, lifestyle and choices given. Recommend healthy diet.  Recommend approximately 150 minutes/week of moderate intensity exercise. Resistance training is good for building muscles and for bone health. Muscle mass helps to increase our metabolism and to burn more calories at rest.  Limit alcohol consumption: no more than one drink per day for women and 2 drinks per day for me. Recommend regular dental and vision exams. Always use seatbelt/lap and shoulder restraints. Recommend using smoke alarms and checking batteries at least twice a year. Recommend using sunscreen when outside.  Agrees with plan of care discussed.  Questions answered.      Return in about 1 year (around 06/30/2025) for CPE with labs.     Darice JONELLE Brownie, FNP

## 2024-06-26 NOTE — Assessment & Plan Note (Signed)
 Uric acid monitoring. Allopurinol  100 mg BID. No reported flares.

## 2024-06-26 NOTE — Assessment & Plan Note (Signed)
 Sees urology. MRI previously done. Monitor PSA. Taking Flomax  0.4 mg daily. Reports night time urination 2-3 times.

## 2024-06-27 ENCOUNTER — Ambulatory Visit: Payer: Self-pay | Admitting: Family Medicine

## 2024-06-27 LAB — URIC ACID: Uric Acid: 5.3 mg/dL (ref 3.8–8.4)

## 2024-06-27 LAB — CBC
Hematocrit: 48.9 % (ref 37.5–51.0)
Hemoglobin: 15.8 g/dL (ref 13.0–17.7)
MCH: 29 pg (ref 26.6–33.0)
MCHC: 32.3 g/dL (ref 31.5–35.7)
MCV: 90 fL (ref 79–97)
Platelets: 203 x10E3/uL (ref 150–450)
RBC: 5.45 x10E6/uL (ref 4.14–5.80)
RDW: 13.1 % (ref 11.6–15.4)
WBC: 6.7 x10E3/uL (ref 3.4–10.8)

## 2024-06-27 LAB — COMPREHENSIVE METABOLIC PANEL WITH GFR
ALT: 30 IU/L (ref 0–44)
AST: 24 IU/L (ref 0–40)
Albumin: 4.6 g/dL (ref 3.9–4.9)
Alkaline Phosphatase: 73 IU/L (ref 47–123)
BUN/Creatinine Ratio: 19 (ref 10–24)
BUN: 25 mg/dL (ref 8–27)
Bilirubin Total: 0.7 mg/dL (ref 0.0–1.2)
CO2: 23 mmol/L (ref 20–29)
Calcium: 9.8 mg/dL (ref 8.6–10.2)
Chloride: 99 mmol/L (ref 96–106)
Creatinine, Ser: 1.34 mg/dL — ABNORMAL HIGH (ref 0.76–1.27)
Globulin, Total: 2.6 g/dL (ref 1.5–4.5)
Glucose: 91 mg/dL (ref 70–99)
Potassium: 4.9 mmol/L (ref 3.5–5.2)
Sodium: 136 mmol/L (ref 134–144)
Total Protein: 7.2 g/dL (ref 6.0–8.5)
eGFR: 58 mL/min/1.73 — ABNORMAL LOW (ref >59)

## 2024-06-27 LAB — HEMOGLOBIN A1C
Est. average glucose Bld gHb Est-mCnc: 111 mg/dL
Hgb A1c MFr Bld: 5.5 % (ref 4.8–5.6)

## 2024-06-27 LAB — LIPID PANEL
Chol/HDL Ratio: 3.6 ratio (ref 0.0–5.0)
Cholesterol, Total: 175 mg/dL (ref 100–199)
HDL: 49 mg/dL (ref >39)
LDL Chol Calc (NIH): 100 mg/dL — ABNORMAL HIGH (ref 0–99)
Triglycerides: 151 mg/dL — ABNORMAL HIGH (ref 0–149)
VLDL Cholesterol Cal: 26 mg/dL (ref 5–40)

## 2024-06-27 LAB — PSA: Prostate Specific Ag, Serum: 5.4 ng/mL — ABNORMAL HIGH (ref 0.0–4.0)

## 2024-06-27 LAB — TSH+FREE T4
Free T4: 1.27 ng/dL (ref 0.82–1.77)
TSH: 2.04 u[IU]/mL (ref 0.450–4.500)

## 2024-07-01 DIAGNOSIS — J3081 Allergic rhinitis due to animal (cat) (dog) hair and dander: Secondary | ICD-10-CM | POA: Diagnosis not present

## 2024-07-01 DIAGNOSIS — J301 Allergic rhinitis due to pollen: Secondary | ICD-10-CM | POA: Diagnosis not present

## 2024-07-01 DIAGNOSIS — J3089 Other allergic rhinitis: Secondary | ICD-10-CM | POA: Diagnosis not present

## 2024-07-10 DIAGNOSIS — J3089 Other allergic rhinitis: Secondary | ICD-10-CM | POA: Diagnosis not present

## 2024-07-10 DIAGNOSIS — J301 Allergic rhinitis due to pollen: Secondary | ICD-10-CM | POA: Diagnosis not present

## 2024-07-10 DIAGNOSIS — J3081 Allergic rhinitis due to animal (cat) (dog) hair and dander: Secondary | ICD-10-CM | POA: Diagnosis not present

## 2024-07-23 DIAGNOSIS — J301 Allergic rhinitis due to pollen: Secondary | ICD-10-CM | POA: Diagnosis not present

## 2024-07-23 DIAGNOSIS — J3089 Other allergic rhinitis: Secondary | ICD-10-CM | POA: Diagnosis not present

## 2024-07-23 DIAGNOSIS — J3081 Allergic rhinitis due to animal (cat) (dog) hair and dander: Secondary | ICD-10-CM | POA: Diagnosis not present

## 2024-08-13 ENCOUNTER — Ambulatory Visit (INDEPENDENT_AMBULATORY_CARE_PROVIDER_SITE_OTHER): Admitting: Urgent Care

## 2024-08-13 VITALS — BP 114/70 | HR 71 | Ht 69.0 in | Wt 187.0 lb

## 2024-08-13 DIAGNOSIS — I340 Nonrheumatic mitral (valve) insufficiency: Secondary | ICD-10-CM | POA: Diagnosis not present

## 2024-08-13 DIAGNOSIS — M791 Myalgia, unspecified site: Secondary | ICD-10-CM

## 2024-08-13 DIAGNOSIS — I10 Essential (primary) hypertension: Secondary | ICD-10-CM | POA: Diagnosis not present

## 2024-08-13 DIAGNOSIS — K760 Fatty (change of) liver, not elsewhere classified: Secondary | ICD-10-CM | POA: Diagnosis not present

## 2024-08-13 DIAGNOSIS — M1A9XX0 Chronic gout, unspecified, without tophus (tophi): Secondary | ICD-10-CM

## 2024-08-13 DIAGNOSIS — N400 Enlarged prostate without lower urinary tract symptoms: Secondary | ICD-10-CM | POA: Diagnosis not present

## 2024-08-13 DIAGNOSIS — J301 Allergic rhinitis due to pollen: Secondary | ICD-10-CM | POA: Diagnosis not present

## 2024-08-13 DIAGNOSIS — E785 Hyperlipidemia, unspecified: Secondary | ICD-10-CM | POA: Diagnosis not present

## 2024-08-13 DIAGNOSIS — N1831 Chronic kidney disease, stage 3a: Secondary | ICD-10-CM

## 2024-08-13 NOTE — Patient Instructions (Signed)
 Great to meet you!!  I ordered an ECHO and CT calcium score for you.  Please follow up with me in Nov or sooner as needed.  For your back, please do the following:  Week 1: eat 30 min prior to your walk x 1 week Week 2: do not eat within 3 hours of your walk Week 3: try ibuprofen, tylenol or baclofen  30 min prior to your walks  Please let me know if it continues or worsens with any of the above so we know how to proceed with your pain.

## 2024-08-13 NOTE — Progress Notes (Signed)
 "  Established Patient Office Visit  Subjective:  Patient ID: Derek Kent, male    DOB: January 20, 1956  Age: 69 y.o. MRN: [id number]  Chief Complaint  Patient presents with   Transitions Of Care    [facility] form Dr. Colette to [facility]- no c/o today just meet and greet with new provider.  Patient did mention that he is currently getting weekly allergy injections as this does not show on his chart.     HPI  Discussed the use of AI scribe software for clinical note transcription with the patient, who gave verbal consent to proceed.  History of Present Illness   Derek Kent is a 69 year old male who presents for a new patient visit to establish care.  He has a history of hypertension, managed with lisinopril  for approximately eight years, with good tolerance and no side effects.  Gout has been managed with allopurinol  for about ten years, effectively controlling flares. Uric acid levels have decreased from 8.5 to 5.5, and he has not experienced any gout attacks since starting the medication.  For benign prostatic hyperplasia (BPH), he takes tamsulosin , prescribed about four years ago. He follows up with a urologist for elevated PSA levels, which have increased from 3.9 to over 5. An MRI of the prostate showed no evidence of cancer, but the prostate is two and a half times normal size. He is scheduled for a 4K score blood test in March.  He has been taking pravastatin  for two years to manage cholesterol levels. Triglycerides have decreased from 188 to slightly above normal without dietary changes.  He takes omega-3 with vitamin D and an additional vitamin D supplement due to a past deficiency. His vitamin D levels have been stable since supplementation.  He uses Flonase and diphenhydramine seasonally for allergies, primarily before and after summer. He receives weekly allergy shots and reports good control of symptoms with this regimen.  He has chronic kidney disease (CKD) with  kidney function (GFR) fluctuating between 54 and 64. He was initially referred to a nephrologist due to proteinuria.  He was diagnosed with fatty liver in 1991, and a liver elastography three to four years ago showed no progression.  He experiences back and shoulder pain during walks, particularly after eating, which resolves upon stopping. The pain is described as muscular, located between the shoulder blades and into the neck, occasionally radiating to the arms. He has undergone spinal x-rays and physical therapy without relief. The pain is only present while walking and is not present in office at time of consultation.  He does have hx of MR and would like to update an ECHO, last done in 2018. Asx. He also would like to update a calcium score CT as precautionary. Last one done revealed a score of 0.      Patient Active Problem List   Diagnosis Date Noted   Hyperlipidemia 08/14/2024   Benign prostatic hyperplasia without lower urinary tract symptoms 08/14/2024   Fatty liver 08/14/2024   Chronic gout without tophus 06/26/2024   Screening PSA (prostate specific antigen) 06/23/2024   DDD (degenerative disc disease), cervical 12/24/2023   Tongue abnormality 09/24/2023   Allergic rhinitis due to pollen 10/09/2022   Stage 3a chronic kidney disease (HCC) 10/09/2022   Mitral regurgitation 11/01/2016   Family history of premature coronary artery disease 11/02/2015   Benign essential HTN 11/02/2015   PVC's (premature ventricular contractions) 11/02/2015   Past Medical History:  Diagnosis Date   Allergy  Seasonal allergies   Atypical chest pain    normal echo and nuclear stress test 2017   CKD (chronic kidney disease) stage 3, GFR 30-59 ml/min (HCC)    proteinuria   DDD (degenerative disc disease), cervical    Family history of premature coronary artery disease 11/02/2015   Fatty liver    GERD (gastroesophageal reflux disease)    Infrequent   Gout    Hiatal hernia    Hypertension     PVC's (premature ventricular contractions) 11/02/2015   Sleep apnea    Past Surgical History:  Procedure Laterality Date   NO PAST SURGERIES     Social History[1]    ROS: as noted in HPI  Objective:     BP 114/70 (BP Location: Left Arm, Patient Position: Sitting, Cuff Size: Normal)   Pulse 71   Ht 5' 9 (1.753 m)   Wt 187 lb (84.8 kg)   SpO2 95%   BMI 27.62 kg/m  BP Readings from Last 3 Encounters:  08/13/24 114/70  06/26/24 131/75  06/23/24 122/62   Wt Readings from Last 3 Encounters:  08/13/24 187 lb (84.8 kg)  06/26/24 184 lb 1.6 oz (83.5 kg)  06/23/24 187 lb (84.8 kg)      Physical Exam Vitals and nursing note reviewed. Exam conducted with a chaperone present.  Constitutional:      General: He is not in acute distress.    Appearance: Normal appearance. He is not ill-appearing, toxic-appearing or diaphoretic.  HENT:     Head: Normocephalic and atraumatic.     Right Ear: Tympanic membrane, ear canal and external ear normal. There is no impacted cerumen.     Left Ear: Tympanic membrane, ear canal and external ear normal. There is no impacted cerumen.     Nose: Nose normal.     Mouth/Throat:     Mouth: Mucous membranes are moist.     Pharynx: Oropharynx is clear. No oropharyngeal exudate or posterior oropharyngeal erythema.  Eyes:     General: No scleral icterus.       Right eye: No discharge.        Left eye: No discharge.     Extraocular Movements: Extraocular movements intact.     Pupils: Pupils are equal, round, and reactive to light.  Cardiovascular:     Rate and Rhythm: Normal rate.     Heart sounds: Murmur (very faint murmur noted on L #2 ICS, grade 1/6 with no radiation) heard.  Pulmonary:     Effort: Pulmonary effort is normal. No respiratory distress.  Musculoskeletal:     Cervical back: Normal range of motion and neck supple. No rigidity or tenderness.  Lymphadenopathy:     Cervical: No cervical adenopathy.  Skin:    General: Skin is warm  and dry.     Findings: No erythema or rash.  Neurological:     General: No focal deficit present.     Mental Status: He is alert and oriented to person, place, and time.      No results found for any visits on 08/13/24.  Last CBC Lab Results  Component Value Date   WBC 6.7 06/26/2024   HGB 15.8 06/26/2024   HCT 48.9 06/26/2024   MCV 90 06/26/2024   MCH 29.0 06/26/2024   RDW 13.1 06/26/2024   PLT 203 06/26/2024   Last metabolic panel Lab Results  Component Value Date   GLUCOSE 91 06/26/2024   NA 136 06/26/2024   K 4.9 06/26/2024   CL  99 06/26/2024   CO2 23 06/26/2024   BUN 25 06/26/2024   CREATININE 1.34 (H) 06/26/2024   EGFR 58 (L) 06/26/2024   CALCIUM 9.8 06/26/2024   PROT 7.2 06/26/2024   ALBUMIN 4.6 06/26/2024   LABGLOB 2.6 06/26/2024   BILITOT 0.7 06/26/2024   ALKPHOS 73 06/26/2024   AST 24 06/26/2024   ALT 30 06/26/2024   ANIONGAP 9 10/16/2015   Last lipids Lab Results  Component Value Date   CHOL 175 06/26/2024   HDL 49 06/26/2024   LDLCALC 100 (H) 06/26/2024   TRIG 151 (H) 06/26/2024   CHOLHDL 3.6 06/26/2024   Last hemoglobin A1c Lab Results  Component Value Date   HGBA1C 5.5 06/26/2024   Last thyroid  functions Lab Results  Component Value Date   TSH 2.040 06/26/2024   FREET4 1.27 06/26/2024   Last vitamin D No results found for: 25OHVITD2, 25OHVITD3, VD25OH Last vitamin B12 and Folate No results found for: VITAMINB12, FOLATE    The 10-year ASCVD risk score (Arnett DK, et al., 2019) is: 14.1%  Assessment & Plan:  Nonrheumatic mitral valve regurgitation -     ECHOCARDIOGRAM COMPLETE; Future  Hyperlipidemia, unspecified hyperlipidemia type -     CT CARDIAC SCORING (SELF PAY ONLY); Future  Stage 3a chronic kidney disease (HCC)  Benign essential HTN -     CT CARDIAC SCORING (SELF PAY ONLY); Future  Chronic gout without tophus, unspecified cause, unspecified site  Muscle tension pain  Benign prostatic hyperplasia  without lower urinary tract symptoms  Seasonal allergic rhinitis due to pollen  Fatty liver  Assessment and Plan    Musculoskeletal pain, upper back and neck Intermittent pain possibly related to walking. Differential includes musculoskeletal pain, esophageal spasm, or reflux. Previous spinal x-rays showed moderate degenerative disc disease, but pain is not consistent with this finding. - Conduct three-week trial: first week eat before walking, second week wait two hours after eating before walking, third week take Tylenol or ibuprofen before walking. - Consider baclofen  if pain persists and is musculoskeletal in nature. (Pt has 5mg  of this at home already)  Hypertension Well-controlled with lisinopril . Blood pressure stable and within normal range. - Continue lisinopril  as prescribed.  Chronic kidney disease stage 3a Well-managed with stable creatinine levels. Lisinopril  may have reduced proteinuria. - Continue current management and monitoring of kidney function. - Ensure adequate hydration and avoid excessive use of anti-inflammatories. - pt sees nephrology every May.  Benign prostatic hyperplasia without lower urinary tract symptoms Symptoms well-controlled with tamsulosin . Recent MRI showed no prostate cancer. PSA levels increased, urology managing with 4K score test in March. - Continue tamsulosin  as prescribed. - Follow up with urology for PSA management and 4K score test in March.  Gout Well-controlled with allopurinol . No recent flares, uric acid levels within target range. - Continue allopurinol  as prescribed.  Hyperlipidemia Managed with pravastatin . LDL within target range, triglycerides slightly above normal but stable. - Continue pravastatin  as prescribed. - will repeat calcium score per pt request.  Allergic rhinitis Symptoms controlled with Flonase, diphenhydramine, and weekly allergy shots. - Continue current allergy management regimen.  Fatty liver  disease Diagnosed in 1991. Previous liver elastography normal. - Continue monitoring liver function tests. - Encouraged weight management and healthy lifestyle.  Gastroesophageal reflux disease Symptoms mild, managed with as-needed Pepcid. - Continue as-needed use of Pepcid for symptom management.  Mitral valve regurgitation Noted on echo years ago. Pt asx from valvular standpoint but would like to monitor progression of valvular disease. - repeat  ECHO  General Health Maintenance Routine health maintenance discussed, including medication management and follow-up visits. - Schedule follow-up visits twice a year, with annual labs as needed. - Requested copy of nephrologist's labs for review. - Ordered calcium score and echocardiogram due to previous mild mitral regurgitation.         Return in about 10 months (around 06/22/2025) for Annual Physical.   [facility] LITT[facility] G[facility], PA     [1]  Social History Tobacco Use   Smoking status: Never   Smokeless tobacco: Never  Vaping Use   Vaping status: Never Used  Substance Use Topics   Alcohol use: No   Drug use: No   "

## 2024-08-14 ENCOUNTER — Ambulatory Visit (INDEPENDENT_AMBULATORY_CARE_PROVIDER_SITE_OTHER): Payer: Self-pay

## 2024-08-14 ENCOUNTER — Encounter: Payer: Self-pay | Admitting: Urgent Care

## 2024-08-14 DIAGNOSIS — I10 Essential (primary) hypertension: Secondary | ICD-10-CM

## 2024-08-14 DIAGNOSIS — E785 Hyperlipidemia, unspecified: Secondary | ICD-10-CM

## 2024-08-14 DIAGNOSIS — K760 Fatty (change of) liver, not elsewhere classified: Secondary | ICD-10-CM | POA: Insufficient documentation

## 2024-08-14 DIAGNOSIS — N400 Enlarged prostate without lower urinary tract symptoms: Secondary | ICD-10-CM | POA: Insufficient documentation

## 2024-08-15 ENCOUNTER — Ambulatory Visit: Payer: Self-pay | Admitting: Urgent Care

## 2024-08-18 ENCOUNTER — Other Ambulatory Visit: Payer: Self-pay | Admitting: Urgent Care

## 2024-08-18 ENCOUNTER — Ambulatory Visit (HOSPITAL_BASED_OUTPATIENT_CLINIC_OR_DEPARTMENT_OTHER)
Admission: RE | Admit: 2024-08-18 | Discharge: 2024-08-18 | Disposition: A | Discharge status: Home / Self Care | Source: Ambulatory Visit | Attending: Urgent Care | Admitting: Urgent Care

## 2024-08-18 DIAGNOSIS — I340 Nonrheumatic mitral (valve) insufficiency: Secondary | ICD-10-CM | POA: Insufficient documentation

## 2024-08-18 DIAGNOSIS — I10 Essential (primary) hypertension: Secondary | ICD-10-CM

## 2024-08-18 DIAGNOSIS — N400 Enlarged prostate without lower urinary tract symptoms: Secondary | ICD-10-CM

## 2024-08-18 DIAGNOSIS — K760 Fatty (change of) liver, not elsewhere classified: Secondary | ICD-10-CM

## 2024-08-18 DIAGNOSIS — M1A9XX0 Chronic gout, unspecified, without tophus (tophi): Secondary | ICD-10-CM

## 2024-08-18 DIAGNOSIS — J301 Allergic rhinitis due to pollen: Secondary | ICD-10-CM

## 2024-08-18 DIAGNOSIS — M791 Myalgia, unspecified site: Secondary | ICD-10-CM

## 2024-08-18 DIAGNOSIS — N1831 Chronic kidney disease, stage 3a: Secondary | ICD-10-CM

## 2024-08-18 DIAGNOSIS — E785 Hyperlipidemia, unspecified: Secondary | ICD-10-CM

## 2024-08-18 LAB — ECHOCARDIOGRAM COMPLETE
AR max vel: 5.21 cm2
AV Mean grad: 12.8 mmHg
AV Peak grad: 19.9 mmHg
Ao pk vel: 2.23 m/s
Area-P 1/2: 13.08 cm2
Calc EF: 65.4 %
MV M vel: 6.23 m/s
MV Peak grad: 155.3 mmHg
S' Lateral: 2.4 cm
Single Plane A2C EF: 65.1 %
Single Plane A4C EF: 67.7 %

## 2025-06-24 ENCOUNTER — Ambulatory Visit

## 2025-06-30 ENCOUNTER — Encounter: Admitting: Urgent Care

## 2025-06-30 ENCOUNTER — Encounter: Admitting: Family Medicine
